# Patient Record
Sex: Female | Born: 1995 | Race: White | Hispanic: No | Marital: Single | Smoking: Former smoker
Health system: Southern US, Community
[De-identification: ages and names within clinical notes are randomized; demographics above are authoritative.]

## PROBLEM LIST (undated history)

## (undated) ENCOUNTER — Inpatient Hospital Stay (HOSPITAL_COMMUNITY): Payer: Self-pay

## (undated) DIAGNOSIS — F419 Anxiety disorder, unspecified: Secondary | ICD-10-CM

## (undated) DIAGNOSIS — Z789 Other specified health status: Secondary | ICD-10-CM

## (undated) DIAGNOSIS — D649 Anemia, unspecified: Secondary | ICD-10-CM

## (undated) HISTORY — PX: NO PAST SURGERIES: SHX2092

## (undated) HISTORY — DX: Anxiety disorder, unspecified: F41.9

---

## 2017-09-13 NOTE — L&D Delivery Note (Signed)
Patient: Hannah SpragueMeilanie Bradford MRN: [id number]  GBS status: negative, IAP given: yes for chorio (amp+gent)  Patient is a 22 y.o. now G1P1 s/p NSVD at 6225w2d, who was admitted for IOL for PROM. SROM 17h 5465m prior to delivery with clear fluid.    Delivery Note At 6:03 AM a viable female was delivered via SVD (Presentation: cephalic; ROA).  APGAR: 4, 6, 9; weight: pending (appears AGA).   Placenta status: intact, 3-vessel cord.  Cord:  with the following complications: none.  Head delivered ROA. No nuchal cord present. Shoulder and body delivered in usual fashion. Infant with spontaneous cry, placed on mother's abdomen, dried and bulb suctioned. Cord clamped x 2 after 1-minute delay, and cut by family member. After initial cry, infant slow to transition and transferred to warmer for resuscitation. NICU team called.  Cord blood drawn. Placenta delivered spontaneously with gentle cord traction. Fundus firm with massage and Pitocin. Perineum inspected and found to have no lacerations, with good hemostasis noted.  Anesthesia: Epidural   Episiotomy: None Lacerations: None Suture Repair: None Est. Blood Loss (mL): 100  Mom to postpartum.  Baby to Couplet care / Skin to Skin.  Gwenevere AbbotNimeka Krishna Dancel Ob Fellow 09/07/2018, 6:27 AM

## 2018-01-25 ENCOUNTER — Inpatient Hospital Stay (HOSPITAL_COMMUNITY): Payer: Medicaid Other

## 2018-01-25 ENCOUNTER — Inpatient Hospital Stay (HOSPITAL_COMMUNITY)
Admission: AD | Admit: 2018-01-25 | Discharge: 2018-01-25 | Disposition: A | Discharge status: Home / Self Care | Payer: Medicaid Other | Source: Ambulatory Visit | Attending: Obstetrics and Gynecology | Admitting: Obstetrics and Gynecology

## 2018-01-25 ENCOUNTER — Encounter (HOSPITAL_COMMUNITY): Payer: Self-pay

## 2018-01-25 ENCOUNTER — Other Ambulatory Visit: Payer: Self-pay

## 2018-01-25 DIAGNOSIS — M545 Low back pain: Secondary | ICD-10-CM | POA: Diagnosis not present

## 2018-01-25 DIAGNOSIS — Z3A01 Less than 8 weeks gestation of pregnancy: Secondary | ICD-10-CM | POA: Diagnosis not present

## 2018-01-25 DIAGNOSIS — Z3491 Encounter for supervision of normal pregnancy, unspecified, first trimester: Secondary | ICD-10-CM

## 2018-01-25 DIAGNOSIS — R109 Unspecified abdominal pain: Secondary | ICD-10-CM | POA: Diagnosis not present

## 2018-01-25 DIAGNOSIS — O10911 Unspecified pre-existing hypertension complicating pregnancy, first trimester: Secondary | ICD-10-CM

## 2018-01-25 DIAGNOSIS — O26891 Other specified pregnancy related conditions, first trimester: Secondary | ICD-10-CM | POA: Diagnosis not present

## 2018-01-25 DIAGNOSIS — O10011 Pre-existing essential hypertension complicating pregnancy, first trimester: Secondary | ICD-10-CM | POA: Insufficient documentation

## 2018-01-25 LAB — URINALYSIS, ROUTINE W REFLEX MICROSCOPIC
BILIRUBIN URINE: NEGATIVE
Glucose, UA: NEGATIVE mg/dL
Hgb urine dipstick: NEGATIVE
KETONES UR: NEGATIVE mg/dL
Leukocytes, UA: NEGATIVE
NITRITE: NEGATIVE
PROTEIN: NEGATIVE mg/dL
SPECIFIC GRAVITY, URINE: 1.016 (ref 1.005–1.030)
pH: 6 (ref 5.0–8.0)

## 2018-01-25 LAB — CBC
HCT: 38.6 % (ref 36.0–46.0)
Hemoglobin: 12.7 g/dL (ref 12.0–15.0)
MCH: 27 pg (ref 26.0–34.0)
MCHC: 32.9 g/dL (ref 30.0–36.0)
MCV: 82.1 fL (ref 78.0–100.0)
Platelets: 279 10*3/uL (ref 150–400)
RBC: 4.7 MIL/uL (ref 3.87–5.11)
RDW: 13.5 % (ref 11.5–15.5)
WBC: 10.7 10*3/uL — ABNORMAL HIGH (ref 4.0–10.5)

## 2018-01-25 LAB — HCG, QUANTITATIVE, PREGNANCY: hCG, Beta Chain, Quant, S: 70649 m[IU]/mL — ABNORMAL HIGH (ref <5)

## 2018-01-25 LAB — WET PREP, GENITAL
Clue Cells Wet Prep HPF POC: NONE SEEN
Trich, Wet Prep: NONE SEEN
Yeast Wet Prep HPF POC: NONE SEEN

## 2018-01-25 LAB — POCT PREGNANCY, URINE: PREG TEST UR: POSITIVE — AB

## 2018-01-25 NOTE — MAU Note (Signed)
Pt states she had a positive pregnancy last Tuesday. Pt states she has had severe back and abdominal pain for the past few days that has worsened today.   Denies vaginal bleeding

## 2018-01-25 NOTE — MAU Provider Note (Signed)
Chief Complaint: Back Pain and Abdominal Pain   First Provider Initiated Contact with Patient 01/25/18 1953     SUBJECTIVE HPI: Hannah Bradford is a 22 y.o. G1P0 at [redacted]w[redacted]d by LMP who presents to maternity admissions reporting abdominal pain and back pain. She reports abdominal pain that has been occurring since "finding out she was pregnant" during the month of April. She describes intermittent lower abdominal cramping, rates pain 3/10- has not taken any medication for pain. Associated symptoms with abdominal pain is back pain, she reports constant back pain since before she got pregnant. She describes the pain as constant aching in lower back, rates pain 7/10- has not taken any medication for pain. She denies vaginal bleeding, vaginal discharge, vaginal itching/burning, urinary symptoms, h/a, dizziness, n/v, or fever/chills.  She reports recent IC this morning.   History reviewed. No pertinent past medical history. History reviewed. No pertinent surgical history. Social History   Socioeconomic History  . Marital status: Single    Spouse name: Not on file  . Number of children: Not on file  . Years of education: Not on file  . Highest education level: Not on file  Occupational History  . Not on file  Social Needs  . Financial resource strain: Not on file  . Food insecurity:    Worry: Not on file    Inability: Not on file  . Transportation needs:    Medical: Not on file    Non-medical: Not on file  Tobacco Use  . Smoking status: Never Smoker  . Smokeless tobacco: Never Used  Substance and Sexual Activity  . Alcohol use: Never    Frequency: Never  . Drug use: Not Currently    Types: Marijuana    Comment: quit when pt found out she was pregnant  . Sexual activity: Yes    Birth control/protection: None  Lifestyle  . Physical activity:    Days per week: Not on file    Minutes per session: Not on file  . Stress: Not on file  Relationships  . Social connections:    Talks on  phone: Not on file    Gets together: Not on file    Attends religious service: Not on file    Active member of club or organization: Not on file    Attends meetings of clubs or organizations: Not on file    Relationship status: Not on file  . Intimate partner violence:    Fear of current or ex partner: Not on file    Emotionally abused: Not on file    Physically abused: Not on file    Forced sexual activity: Not on file  Other Topics Concern  . Not on file  Social History Narrative  . Not on file   No current facility-administered medications on file prior to encounter.    No current outpatient medications on file prior to encounter.   Not on File  ROS:  Review of Systems  Constitutional: Negative.   Respiratory: Negative.   Cardiovascular: Negative.   Gastrointestinal: Positive for abdominal pain. Negative for constipation, diarrhea, nausea and vomiting.  Genitourinary: Negative.   Musculoskeletal: Positive for back pain.   I have reviewed patient's Past Medical Hx, Surgical Hx, Family Hx, Social Hx, medications and allergies.   Physical Exam   Patient Vitals for the past 24 hrs:  BP Temp Temp src Pulse Resp SpO2 Weight  01/25/18 2100 134/62 - - 69 - - -  01/25/18 1942 (!) 146/75 98.7 F (37.1 C)  Oral 96 16 100 % -  01/25/18 1931 - - - - - - 134 lb 12 oz (61.1 kg)   Constitutional: Well-developed, well-nourished female in no acute distress.  Cardiovascular: normal rate Respiratory: normal effort GI: Abd soft, non-tender. Pos BS x 4 MS: Extremities nontender, no edema, normal ROM Neurologic: Alert and oriented x 4.  GU: Neg CVAT.  PELVIC EXAM: blind swabs obtained  Bimanual exam: Cervix 0/long/high, firm, anterior, neg CMT, uterus nontender, nonenlarged, adnexa without tenderness, enlargement, or mass  LAB RESULTS Results for orders placed or performed during the hospital encounter of 01/25/18 (from the past 24 hour(s))  Urinalysis, Routine w reflex microscopic      Status: None   Collection Time: 01/25/18  7:30 PM  Result Value Ref Range   Color, Urine YELLOW YELLOW   APPearance CLEAR CLEAR   Specific Gravity, Urine 1.016 1.005 - 1.030   pH 6.0 5.0 - 8.0   Glucose, UA NEGATIVE NEGATIVE mg/dL   Hgb urine dipstick NEGATIVE NEGATIVE   Bilirubin Urine NEGATIVE NEGATIVE   Ketones, ur NEGATIVE NEGATIVE mg/dL   Protein, ur NEGATIVE NEGATIVE mg/dL   Nitrite NEGATIVE NEGATIVE   Leukocytes, UA NEGATIVE NEGATIVE  Pregnancy, urine POC     Status: Abnormal   Collection Time: 01/25/18  7:41 PM  Result Value Ref Range   Preg Test, Ur POSITIVE (A) NEGATIVE  Wet prep, genital     Status: Abnormal   Collection Time: 01/25/18  7:58 PM  Result Value Ref Range   Yeast Wet Prep HPF POC NONE SEEN NONE SEEN   Trich, Wet Prep NONE SEEN NONE SEEN   Clue Cells Wet Prep HPF POC NONE SEEN NONE SEEN   WBC, Wet Prep HPF POC MODERATE (A) NONE SEEN   Sperm MANY BACTERIA SEEN   CBC     Status: Abnormal   Collection Time: 01/25/18  8:08 PM  Result Value Ref Range   WBC 10.7 (H) 4.0 - 10.5 K/uL   RBC 4.70 3.87 - 5.11 MIL/uL   Hemoglobin 12.7 12.0 - 15.0 g/dL   HCT 96.0 45.4 - 09.8 %   MCV 82.1 78.0 - 100.0 fL   MCH 27.0 26.0 - 34.0 pg   MCHC 32.9 30.0 - 36.0 g/dL   RDW 11.9 14.7 - 82.9 %   Platelets 279 150 - 400 K/uL  ABO/Rh     Status: None (Preliminary result)   Collection Time: 01/25/18  8:08 PM  Result Value Ref Range   ABO/RH(D)      O POS Performed at [facility], [address], [place], [place] [address]     IMAGING US Ob Less Than 14 Weeks With Ob Transvaginal  Result Date: 01/25/2018 CLINICAL DATA:  Severe back and abdominal pain, no vaginal bleeding EXAM: OBSTETRIC <14 WK [place] AND TRANSVAGINAL OB US TECHNIQUE: Both transabdominal and transvaginal ultrasound examinations were performed for complete evaluation of the gestation as well as the maternal uterus, adnexal regions, and pelvic cul-de-sac. Transvaginal technique was performed to assess  early pregnancy. COMPARISON:  None. FINDINGS: Intrauterine gestational sac: Single intrauterine pregnancy Yolk sac:  Visible Embryo:  Visible Cardiac Activity: Visible Heart Rate: 120 bpm CRL: 7.1 mm   6 w   4 d                  [place] EDC: 09/16/2018 Subchorionic hemorrhage:  None visualized. Maternal uterus/adnexae: Ovaries are within normal limits. Left ovary measures 1.3 x 2.4 x 1.8 cm. The right ovary measures  3.1 x 1.8 x 3.2 cm. No significant free fluid. IMPRESSION: Single viable intrauterine pregnancy as above. There are no specific abnormalities seen Electronically Signed   By: Jasmine Pang M.D.   On: 01/25/2018 21:01    MAU Management/MDM: Orders Placed This Encounter  Procedures  . Wet prep, genital  . US OB LESS THAN 14 WEEKS WITH OB TRANSVAGINAL  . Urinalysis, Routine w reflex microscopic  . CBC  . hCG, quantitative, pregnancy  . Pregnancy, urine POC  . ABO/Rh   Wet prep- WNL  UA-negative  CBC-WNL  ABO/Rh- O Pos  GC/C - pending  Discussed results of [place] with patient and reasons to return to MAU   Pt discharged. Pt stable at the time of discharge.   ASSESSMENT 1. Normal IUP (intrauterine pregnancy) on prenatal ultrasound, first trimester   2. Abdominal pain during pregnancy in first trimester   3. Maternal chronic hypertension in first trimester     PLAN Discharge home Make initial prenatal appointment  Return to MAU as needed for increased abdominal pain and/or vaginal bleeding like a period  Continue prenatal vitamins    Allergies as of 01/25/2018   Not on File     Medication List    You have not been prescribed any medications.      Steward Drone  Certified Nurse-Midwife 01/25/2018  9:13 PM

## 2018-01-26 LAB — GC/CHLAMYDIA PROBE AMP (~~LOC~~) NOT AT ARMC
Chlamydia: NEGATIVE
Neisseria Gonorrhea: NEGATIVE

## 2018-01-26 LAB — ABO/RH: ABO/RH(D): O POS

## 2018-03-03 ENCOUNTER — Ambulatory Visit (INDEPENDENT_AMBULATORY_CARE_PROVIDER_SITE_OTHER): Payer: Medicaid Other | Admitting: Nurse Practitioner

## 2018-03-03 ENCOUNTER — Other Ambulatory Visit (HOSPITAL_COMMUNITY)
Admission: RE | Admit: 2018-03-03 | Discharge: 2018-03-03 | Disposition: A | Discharge status: Home / Self Care | Payer: Medicaid Other | Source: Ambulatory Visit | Attending: Nurse Practitioner | Admitting: Nurse Practitioner

## 2018-03-03 ENCOUNTER — Encounter: Payer: Self-pay | Admitting: Nurse Practitioner

## 2018-03-03 VITALS — BP 112/77 | HR 87 | Ht 63.0 in | Wt 128.0 lb

## 2018-03-03 DIAGNOSIS — Z3400 Encounter for supervision of normal first pregnancy, unspecified trimester: Secondary | ICD-10-CM | POA: Diagnosis not present

## 2018-03-03 DIAGNOSIS — O219 Vomiting of pregnancy, unspecified: Secondary | ICD-10-CM | POA: Insufficient documentation

## 2018-03-03 DIAGNOSIS — Z3A00 Weeks of gestation of pregnancy not specified: Secondary | ICD-10-CM | POA: Diagnosis not present

## 2018-03-03 DIAGNOSIS — Z87891 Personal history of nicotine dependence: Secondary | ICD-10-CM | POA: Insufficient documentation

## 2018-03-03 DIAGNOSIS — Z3401 Encounter for supervision of normal first pregnancy, first trimester: Secondary | ICD-10-CM

## 2018-03-03 MED ORDER — DOXYLAMINE-PYRIDOXINE ER 20-20 MG PO TBCR: 1.0000 | EXTENDED_RELEASE_TABLET | Freq: Two times a day (BID) | ORAL | 4 refills | Status: DC

## 2018-03-03 NOTE — Progress Notes (Signed)
Patient is in the office for initial ob visit, fob is involved and living together, no complaints.

## 2018-03-03 NOTE — Patient Instructions (Addendum)
Call 1-800-QUIT-Now for smoking cessation   Morning Sickness Morning sickness is when you feel sick to your stomach (nauseous) during pregnancy. You may feel sick to your stomach and throw up (vomit). You may feel sick in the morning, but you can feel this way any time of day. Some women feel very sick to their stomach and cannot stop throwing up (hyperemesis gravidarum). Follow these instructions at home:  Only take medicines as told by your doctor.  Take multivitamins as told by your doctor. Taking multivitamins before getting pregnant can stop or lessen the harshness of morning sickness.  Eat dry toast or unsalted crackers before getting out of bed.  Eat 5 to 6 small meals a day.  Eat dry and bland foods like rice and baked potatoes.  Do not drink liquids with meals. Drink between meals.  Do not eat greasy, fatty, or spicy foods.  Have someone cook for you if the smell of food causes you to feel sick or throw up.  If you feel sick to your stomach after taking prenatal vitamins, take them at night or with a snack.  Eat protein when you need a snack (nuts, yogurt, cheese).  Eat unsweetened gelatins for dessert.  Wear a bracelet used for sea sickness (acupressure wristband).  Go to a doctor that puts thin needles into certain body points (acupuncture) to improve how you feel.  Do not smoke.  Use a humidifier to keep the air in your house free of odors.  Get lots of fresh air. Contact a doctor if:  You need medicine to feel better.  You feel dizzy or lightheaded.  You are losing weight. Get help right away if:  You feel very sick to your stomach and cannot stop throwing up.  You pass out (faint). This information is not intended to replace advice given to you by your health care provider. Make sure you discuss any questions you have with your health care provider. Document Released: 10/07/2004 Document Revised: 02/05/2016 Document Reviewed: 02/14/2013 Elsevier  Interactive Patient Education  2017 [employer].  First Trimester of Pregnancy The first trimester of pregnancy is from week 1 until the end of week 13 (months 1 through 3). During this time, your baby will begin to develop inside you. At 6-8 weeks, the eyes and face are formed, and the heartbeat can be seen on ultrasound. At the end of 12 weeks, all the baby's organs are formed. Prenatal care is all the medical care you receive before the birth of your baby. Make sure you get good prenatal care and follow all of your doctor's instructions. Follow these instructions at home: Medicines  Take over-the-counter and prescription medicines only as told by your doctor. Some medicines are safe and some medicines are not safe during pregnancy.  Take a prenatal vitamin that contains at least 600 micrograms (mcg) of folic acid.  If you have trouble pooping (constipation), take medicine that will make your stool soft (stool softener) if your doctor approves. Eating and drinking  Eat regular, healthy meals.  Your doctor will tell you the amount of weight gain that is right for you.  Avoid raw meat and uncooked cheese.  If you feel sick to your stomach (nauseous) or throw up (vomit): ? Eat 4 or 5 small meals a day instead of 3 large meals. ? Try eating a few soda crackers. ? Drink liquids between meals instead of during meals.  To prevent constipation: ? Eat foods that are high in fiber, like fresh  fruits and vegetables, whole grains, and beans. ? Drink enough fluids to keep your pee (urine) clear or pale yellow. Activity  Exercise only as told by your doctor. Stop exercising if you have cramps or pain in your lower belly (abdomen) or low back.  Do not exercise if it is too hot, too humid, or if you are in a place of great height (high altitude).  Try to avoid standing for long periods of time. Move your legs often if you must stand in one place for a long time.  Avoid heavy lifting.  Wear  low-heeled shoes. Sit and stand up straight.  You can have sex unless your doctor tells you not to. Relieving pain and discomfort  Wear a good support bra if your breasts are sore.  Take warm water baths (sitz baths) to soothe pain or discomfort caused by hemorrhoids. Use hemorrhoid cream if your doctor says it is okay.  Rest with your legs raised if you have leg cramps or low back pain.  If you have puffy, bulging veins (varicose veins) in your legs: ? Wear support hose or compression stockings as told by your doctor. ? Raise (elevate) your feet for 15 minutes, 3-4 times a day. ? Limit salt in your food. Prenatal care  Schedule your prenatal visits by the twelfth week of pregnancy.  Write down your questions. Take them to your prenatal visits.  Keep all your prenatal visits as told by your doctor. This is important. Safety  Wear your seat belt at all times when driving.  Make a list of emergency phone numbers. The list should include numbers for family, friends, the hospital, and police and fire departments. General instructions  Ask your doctor for a referral to a local prenatal class. Begin classes no later than at the start of month 6 of your pregnancy.  Ask for help if you need counseling or if you need help with nutrition. Your doctor can give you advice or tell you where to go for help.  Do not use hot tubs, steam rooms, or saunas.  Do not douche or use tampons or scented sanitary pads.  Do not cross your legs for long periods of time.  Avoid all herbs and alcohol. Avoid drugs that are not approved by your doctor.  Do not use any tobacco products, including cigarettes, chewing tobacco, and electronic cigarettes. If you need help quitting, ask your doctor. You may get counseling or other support to help you quit.  Avoid cat litter boxes and soil used by cats. These carry germs that can cause birth defects in the baby and can cause a loss of your baby (miscarriage) or  stillbirth.  Visit your dentist. At home, brush your teeth with a soft toothbrush. Be gentle when you floss. Contact a doctor if:  You are dizzy.  You have mild cramps or pressure in your lower belly.  You have a nagging pain in your belly area.  You continue to feel sick to your stomach, you throw up, or you have watery poop (diarrhea).  You have a bad smelling fluid coming from your vagina.  You have pain when you pee (urinate).  You have increased puffiness (swelling) in your face, hands, legs, or ankles. Get help right away if:  You have a fever.  You are leaking fluid from your vagina.  You have spotting or bleeding from your vagina.  You have very bad belly cramping or pain.  You gain or lose weight rapidly.  You  throw up blood. It may look like coffee grounds.  You are around people who have [place] measles, fifth disease, or chickenpox.  You have a very bad headache.  You have shortness of breath.  You have any kind of trauma, such as from a fall or a car accident. Summary  The first trimester of pregnancy is from week 1 until the end of week 13 (months 1 through 3).  To take care of yourself and your unborn baby, you will need to eat healthy meals, take medicines only if your doctor tells you to do so, and do activities that are safe for you and your baby.  Keep all follow-up visits as told by your doctor. This is important as your doctor will have to ensure that your baby is healthy and growing well. This information is not intended to replace advice given to you by your health care provider. Make sure you discuss any questions you have with your health care provider. Document Released: 02/16/2008 Document Revised: 09/07/2016 Document Reviewed: 09/07/2016 Elsevier Interactive Patient Education  2017 [employer].

## 2018-03-03 NOTE — Progress Notes (Signed)
Subjective:   Hannah Bradford is a 22 y.o. G1P0 at 4768w3d by LMP and confirmed by early ultrasound being seen today for her first obstetrical visit.  Her obstetrical history is significant for nausea and vomiting in early pregnancy and she recently quit smoking marijuana and cigarettes.. Patient does intend to breast feed. Pregnancy history fully reviewed.  Patient reports periodic left side pain that stabs and then quickly resolves.  HISTORY: OB History  Gravida Para Term Preterm AB Living  1 0 0 0 0 0  SAB TAB Ectopic Multiple Live Births  0 0 0 0 0    # Outcome Date GA Lbr Len/2nd Weight Sex Delivery Anes PTL Lv  1 Current            History reviewed. No pertinent past medical history. History reviewed. No pertinent surgical history. Family History  Problem Relation Age of Onset  . Hypertension Maternal Grandmother   . Hypertension Paternal Grandmother   . Diabetes Paternal Grandmother    Social History   Tobacco Use  . Smoking status: Former Smoker    Types: Cigarettes    Last attempt to quit: 2019    Years since quitting: 0.4  . Smokeless tobacco: Never Used  Substance Use Topics  . Alcohol use: Never    Frequency: Never  . Drug use: Not Currently    Types: Marijuana    Comment: quit when pt found out she was pregnant   No Known Allergies Current Outpatient Medications on File Prior to Visit  Medication Sig Dispense Refill  . Prenatal Vit-Fe Phos-FA-Omega (VITAFOL GUMMIES) 3.33-0.333-34.8 MG CHEW Chew 3 each by mouth daily.     No current facility-administered medications on file prior to visit.      Exam   Vitals:   03/03/18 1119 03/03/18 1120  BP: 112/77   Pulse: 87   Weight: 128 lb (58.1 kg)   Height:  5\' 3"  (1.6 m)   Fetal Heart Rate (bpm): 154  Uterus:  Fundal Height: 11 cm  Size equal to dates  Pelvic Exam: Perineum: no hemorrhoids, normal perineum   Vulva: normal external genitalia, no lesions   Vagina:  normal mucosa, normal discharge     Cervix: no lesions and normal, pap smear done.    Adnexa: normal adnexa and no mass, fullness, tenderness   Bony Pelvis: average  System: General: well-developed, well-nourished female in no acute distress   Breast:  normal appearance, no masses or tenderness   Skin: normal coloration and turgor, no rashes   Neurologic: oriented, normal, negative, normal mood   Extremities: normal strength, tone, and muscle mass, ROM of all joints is normal   HEENT PERRLA, extraocular movement intact and sclera clear, anicteric   Mouth/Teeth mucous membranes moist and dental hygiene good   Neck supple and no masses   Cardiovascular: regular rate and rhythm   Respiratory:  no respiratory distress, normal breath sounds   Abdomen: soft, non-tender; no masses,  no organomegaly, no pain with palpation in left side     Assessment:   Pregnancy: G1P0 Patient Active Problem List   Diagnosis Date Noted  . Supervision of normal first pregnancy, antepartum 03/03/2018  . Former smoker 03/03/2018  . Nausea and vomiting during pregnancy prior to [redacted] weeks gestation 03/03/2018     Plan:  1. Supervision of normal first pregnancy, antepartum Expect weight gain of 25-35 pounds  - Cytology - PAP - Obstetric Panel, Including HIV - Hemoglobinopathy evaluation - Culture, OB Urine -  Cystic Fibrosis Mutation 97 - Genetic Screening - Enroll Patient in Babyscripts - [place] MFM OB COMP + 14 WK; Future  2. Nausea and vomiting during pregnancy prior to [redacted] weeks gestation Samples of Bonjesta given and Bonjesta prescribed.  Initial labs drawn. Continue prenatal vitamins. Genetic Screening discussed, NIPS: ordered. Ultrasound discussed; fetal anatomic survey: ordered. Problem list reviewed and updated. The nature of  - [facility] with multiple MDs and other Advanced Practice Providers was explained to patient; also emphasized that residents, students are part of our team. Routine  obstetric precautions reviewed. Return in about 1 month (around 03/31/2018).  Return sooner if having problems with vomiting or other medical concerns.  Total face-to-face time with patient: 40 minutes.  Over 50% of encounter was spent on counseling and coordination of care.     Nolene Bernheim, FNP Family Nurse Practitioner, [facility] for [employer], [facility] 03/03/2018 12:25 PM

## 2018-03-05 LAB — URINE CULTURE, OB REFLEX

## 2018-03-05 LAB — CULTURE, OB URINE

## 2018-03-06 LAB — CYTOLOGY - PAP
Chlamydia: NEGATIVE
Diagnosis: NEGATIVE
NEISSERIA GONORRHEA: NEGATIVE

## 2018-03-09 ENCOUNTER — Encounter: Payer: Self-pay | Admitting: *Deleted

## 2018-03-13 LAB — OBSTETRIC PANEL, INCLUDING HIV
ANTIBODY SCREEN: NEGATIVE
BASOS ABS: 0 10*3/uL (ref 0.0–0.2)
BASOS: 0 %
EOS (ABSOLUTE): 0.1 10*3/uL (ref 0.0–0.4)
EOS: 1 %
HEMATOCRIT: 40.2 % (ref 34.0–46.6)
HIV Screen 4th Generation wRfx: NONREACTIVE
Hemoglobin: 12.6 g/dL (ref 11.1–15.9)
Hepatitis B Surface Ag: NEGATIVE
Immature Grans (Abs): 0 10*3/uL (ref 0.0–0.1)
Immature Granulocytes: 0 %
LYMPHS ABS: 1.6 10*3/uL (ref 0.7–3.1)
Lymphs: 16 %
MCH: 26 pg — AB (ref 26.6–33.0)
MCHC: 31.3 g/dL — ABNORMAL LOW (ref 31.5–35.7)
MCV: 83 fL (ref 79–97)
MONOCYTES: 6 %
Monocytes Absolute: 0.6 10*3/uL (ref 0.1–0.9)
NEUTROS ABS: 7.8 10*3/uL — AB (ref 1.4–7.0)
Neutrophils: 77 %
PLATELETS: 307 10*3/uL (ref 150–450)
RBC: 4.84 x10E6/uL (ref 3.77–5.28)
RDW: 13.6 % (ref 12.3–15.4)
RH TYPE: POSITIVE
RPR Ser Ql: NONREACTIVE
RUBELLA: 3.27 {index} (ref >0.99)
WBC: 10.1 10*3/uL (ref 3.4–10.8)

## 2018-03-13 LAB — CYSTIC FIBROSIS MUTATION 97: GENE DIS ANAL CARRIER INTERP BLD/T-IMP: NOT DETECTED

## 2018-03-13 LAB — HEMOGLOBINOPATHY EVALUATION
HEMOGLOBIN A2 QUANTITATION: 2.5 % (ref 1.8–3.2)
HGB A: 97.5 % (ref 96.4–98.8)
HGB C: 0 %
HGB S: 0 %
HGB VARIANT: 0 %
Hemoglobin F Quantitation: 0 % (ref 0.0–2.0)

## 2018-04-03 ENCOUNTER — Encounter: Payer: Medicaid Other | Admitting: Advanced Practice Midwife

## 2018-04-19 ENCOUNTER — Ambulatory Visit (INDEPENDENT_AMBULATORY_CARE_PROVIDER_SITE_OTHER): Payer: Medicaid Other | Admitting: Obstetrics and Gynecology

## 2018-04-19 ENCOUNTER — Encounter: Payer: Self-pay | Admitting: Obstetrics and Gynecology

## 2018-04-19 VITALS — BP 121/78 | HR 87 | Wt 135.0 lb

## 2018-04-19 DIAGNOSIS — Z3400 Encounter for supervision of normal first pregnancy, unspecified trimester: Secondary | ICD-10-CM | POA: Diagnosis not present

## 2018-04-19 NOTE — Progress Notes (Signed)
   PRENATAL VISIT NOTE  Subjective:  Hannah Bradford is a 22 y.o. G1P0 at 1773w1d being seen today for ongoing prenatal care.  She is currently monitored for the following issues for this low-risk pregnancy and has Supervision of normal first pregnancy, antepartum; Former smoker; and Nausea and vomiting during pregnancy prior to [redacted] weeks gestation on their problem list.  Patient reports no complaints.  Contractions: Not present. Vag. Bleeding: None.  Movement: Present. Denies leaking of fluid.   The following portions of the patient's history were reviewed and updated as appropriate: allergies, current medications, past family history, past medical history, past social history, past surgical history and problem list. Problem list updated.  Objective:   Vitals:   04/19/18 1422  BP: 121/78  Pulse: 87  Weight: 135 lb (61.2 kg)    Fetal Status: Fetal Heart Rate (bpm): 142   Movement: Present     General:  Alert, oriented and cooperative. Patient is in no acute distress.  Skin: Skin is warm and dry. No rash noted.   Cardiovascular: Normal heart rate noted  Respiratory: Normal respiratory effort, no problems with respiration noted  Abdomen: Soft, gravid, appropriate for gestational age.  Pain/Pressure: Absent     Pelvic: Cervical exam deferred        Extremities: Normal range of motion.  Edema: None  Mental Status: Normal mood and affect. Normal behavior. Normal judgment and thought content.   Assessment and Plan:  Pregnancy: G1P0 at 2273w1d  1. Supervision of normal first pregnancy, antepartum Patient is doing well without complaints AFP today Anatomy ultrasound 8/28 - AFP, Serum, Open Spina Bifida  Preterm labor symptoms and general obstetric precautions including but not limited to vaginal bleeding, contractions, leaking of fluid and fetal movement were reviewed in detail with the patient. Please refer to After Visit Summary for other counseling recommendations.  Return in about 1  month (around 05/17/2018) for ROB.  Future Appointments  Date Time Provider Department Center  04/19/2018  2:30 PM Henessy Rohrer, Gigi GinPeggy, MD [facility] None  05/10/2018 10:15 AM [facility] US 4 [facility]    Catalina AntiguaPeggy Ruweyda Macknight, MD

## 2018-04-19 NOTE — Progress Notes (Signed)
Pt is G1P0 37109w1d here for ROB. No complaints

## 2018-04-21 LAB — AFP, SERUM, OPEN SPINA BIFIDA
AFP MOM: 1.84
AFP Value: 84.4 ng/mL
Gest. Age on Collection Date: 18.1 weeks
Maternal Age At EDD: 21.8 yr
OSBR RISK 1 IN: 1162
Test Results:: NEGATIVE
WEIGHT: 135 [lb_av]

## 2018-04-26 ENCOUNTER — Ambulatory Visit (HOSPITAL_COMMUNITY)
Admission: RE | Admit: 2018-04-26 | Discharge: 2018-04-26 | Disposition: A | Discharge status: Home / Self Care | Payer: Medicaid Other | Source: Ambulatory Visit | Attending: Nurse Practitioner | Admitting: Nurse Practitioner

## 2018-04-26 DIAGNOSIS — Z3482 Encounter for supervision of other normal pregnancy, second trimester: Secondary | ICD-10-CM | POA: Diagnosis not present

## 2018-04-26 DIAGNOSIS — Z3400 Encounter for supervision of normal first pregnancy, unspecified trimester: Secondary | ICD-10-CM

## 2018-04-26 DIAGNOSIS — Z3A19 19 weeks gestation of pregnancy: Secondary | ICD-10-CM

## 2018-04-26 DIAGNOSIS — Z363 Encounter for antenatal screening for malformations: Secondary | ICD-10-CM | POA: Diagnosis not present

## 2018-05-10 ENCOUNTER — Ambulatory Visit (HOSPITAL_COMMUNITY): Payer: Medicaid Other

## 2018-05-18 ENCOUNTER — Ambulatory Visit (INDEPENDENT_AMBULATORY_CARE_PROVIDER_SITE_OTHER): Payer: Medicaid Other | Admitting: Obstetrics and Gynecology

## 2018-05-18 ENCOUNTER — Encounter: Payer: Self-pay | Admitting: Obstetrics and Gynecology

## 2018-05-18 VITALS — BP 116/79 | HR 74 | Wt 137.0 lb

## 2018-05-18 DIAGNOSIS — Z3400 Encounter for supervision of normal first pregnancy, unspecified trimester: Secondary | ICD-10-CM

## 2018-05-18 MED ORDER — NYSTATIN 100000 UNIT/GM EX OINT: 1.0000 "application " | TOPICAL_OINTMENT | Freq: Two times a day (BID) | CUTANEOUS | 1 refills | Status: DC

## 2018-05-18 MED ORDER — VITAFOL GUMMIES 3.33-0.333-34.8 MG PO CHEW: 2.0000 | CHEWABLE_TABLET | Freq: Every day | ORAL | 6 refills | Status: AC

## 2018-05-18 NOTE — Progress Notes (Signed)
ROB w/ complaints of cramping pain worse at night pt notes some nights she cannot sleep well at all.  Does not want medication management for pain.   Recently dealing w/ stuffy nose and nose bleeds advised can purchase humidifier (cool mist)   Pt had anatomy U/S: 04/26/18

## 2018-05-18 NOTE — Progress Notes (Signed)
   PRENATAL VISIT NOTE  Subjective:  Hannah Bradford is a 22 y.o. G1P0 at [redacted]w[redacted]d being seen today for ongoing prenatal care.  She is currently monitored for the following issues for this low-risk pregnancy and has Supervision of normal first pregnancy, antepartum; Former smoker; and Nausea and vomiting during pregnancy prior to [redacted] weeks gestation on their problem list.  Patient reports no complaints.  Contractions: Not present. Vag. Bleeding: None.  Movement: Present. Denies leaking of fluid.   The following portions of the patient's history were reviewed and updated as appropriate: allergies, current medications, past family history, past medical history, past social history, past surgical history and problem list. Problem list updated.  Objective:   Vitals:   05/18/18 1537  BP: 116/79  Pulse: 74  Weight: 137 lb (62.1 kg)    Fetal Status: Fetal Heart Rate (bpm): 138 Fundal Height: 22 cm Movement: Present     General:  Alert, oriented and cooperative. Patient is in no acute distress.  Skin: Skin is warm and dry. No rash noted.   Cardiovascular: Normal heart rate noted  Respiratory: Normal respiratory effort, no problems with respiration noted  Abdomen: Soft, gravid, appropriate for gestational age.  Pain/Pressure: Present     Pelvic: Cervical exam deferred        Extremities: Normal range of motion.  Edema: None  Mental Status: Normal mood and affect. Normal behavior. Normal judgment and thought content.   Assessment and Plan:  Pregnancy: G1P0 at [redacted]w[redacted]d  1. Supervision of normal first pregnancy, antepartum Patient is doing well  Ultrasound report reviewed Reassurance provided regarding occasional cramping pain   Preterm labor symptoms and general obstetric precautions including but not limited to vaginal bleeding, contractions, leaking of fluid and fetal movement were reviewed in detail with the patient. Please refer to After Visit Summary for other counseling recommendations.    Return in about 4 weeks (around 06/15/2018) for ROB, 2 hr glucola next visit.  No future appointments.  Catalina Antigua, MD

## 2018-06-15 ENCOUNTER — Encounter: Payer: Medicaid Other | Admitting: Obstetrics and Gynecology

## 2018-06-15 ENCOUNTER — Other Ambulatory Visit: Payer: Medicaid Other

## 2018-06-22 ENCOUNTER — Encounter (HOSPITAL_COMMUNITY): Payer: Self-pay

## 2018-06-22 ENCOUNTER — Inpatient Hospital Stay (HOSPITAL_COMMUNITY)
Admission: AD | Admit: 2018-06-22 | Discharge: 2018-06-22 | Disposition: A | Discharge status: Home / Self Care | Payer: Medicaid Other | Source: Ambulatory Visit | Attending: Obstetrics and Gynecology | Admitting: Obstetrics and Gynecology

## 2018-06-22 DIAGNOSIS — O99012 Anemia complicating pregnancy, second trimester: Secondary | ICD-10-CM | POA: Diagnosis not present

## 2018-06-22 DIAGNOSIS — O9989 Other specified diseases and conditions complicating pregnancy, childbirth and the puerperium: Secondary | ICD-10-CM

## 2018-06-22 DIAGNOSIS — W57XXXA Bitten or stung by nonvenomous insect and other nonvenomous arthropods, initial encounter: Secondary | ICD-10-CM

## 2018-06-22 DIAGNOSIS — D649 Anemia, unspecified: Secondary | ICD-10-CM | POA: Diagnosis not present

## 2018-06-22 DIAGNOSIS — Z3A27 27 weeks gestation of pregnancy: Secondary | ICD-10-CM | POA: Diagnosis not present

## 2018-06-22 DIAGNOSIS — T148XXA Other injury of unspecified body region, initial encounter: Secondary | ICD-10-CM | POA: Insufficient documentation

## 2018-06-22 DIAGNOSIS — O26892 Other specified pregnancy related conditions, second trimester: Secondary | ICD-10-CM | POA: Diagnosis not present

## 2018-06-22 DIAGNOSIS — Z87891 Personal history of nicotine dependence: Secondary | ICD-10-CM | POA: Diagnosis not present

## 2018-06-22 DIAGNOSIS — R21 Rash and other nonspecific skin eruption: Secondary | ICD-10-CM | POA: Diagnosis present

## 2018-06-22 DIAGNOSIS — L089 Local infection of the skin and subcutaneous tissue, unspecified: Secondary | ICD-10-CM

## 2018-06-22 LAB — CBC WITH DIFFERENTIAL/PLATELET
Basophils Absolute: 0 10*3/uL (ref 0.0–0.1)
Basophils Relative: 0 %
Eosinophils Absolute: 0.2 10*3/uL (ref 0.0–0.5)
Eosinophils Relative: 2 %
HEMATOCRIT: 29.3 % — AB (ref 36.0–46.0)
HEMOGLOBIN: 9.6 g/dL — AB (ref 12.0–15.0)
LYMPHS ABS: 2.4 10*3/uL (ref 0.7–4.0)
Lymphocytes Relative: 23 %
MCH: 27.3 pg (ref 26.0–34.0)
MCHC: 32.8 g/dL (ref 30.0–36.0)
MCV: 83.2 fL (ref 80.0–100.0)
MONOS PCT: 5 %
Monocytes Absolute: 0.5 10*3/uL (ref 0.1–1.0)
NEUTROS PCT: 70 %
Neutro Abs: 7.4 10*3/uL (ref 1.7–7.7)
Platelets: 247 10*3/uL (ref 150–400)
RBC: 3.52 MIL/uL — ABNORMAL LOW (ref 3.87–5.11)
RDW: 13.1 % (ref 11.5–15.5)
WBC: 10.6 10*3/uL — ABNORMAL HIGH (ref 4.0–10.5)
nRBC: 0 % (ref 0.0–0.2)

## 2018-06-22 LAB — URINALYSIS, ROUTINE W REFLEX MICROSCOPIC
Bilirubin Urine: NEGATIVE
GLUCOSE, UA: NEGATIVE mg/dL
HGB URINE DIPSTICK: NEGATIVE
KETONES UR: NEGATIVE mg/dL
NITRITE: NEGATIVE
Protein, ur: NEGATIVE mg/dL
Specific Gravity, Urine: 1.019 (ref 1.005–1.030)
pH: 7 (ref 5.0–8.0)

## 2018-06-22 MED ORDER — HYDROXYZINE HCL 25 MG PO TABS
25.0000 mg | ORAL_TABLET | Freq: Once | ORAL | Status: AC
  Administered 2018-06-22: 25 mg via ORAL
  Filled 2018-06-22: qty 1

## 2018-06-22 MED ORDER — HYDROXYZINE HCL 25 MG PO TABS: 25.0000 mg | ORAL_TABLET | Freq: Four times a day (QID) | ORAL | 2 refills | Status: DC | PRN

## 2018-06-22 MED ORDER — HYDROCORTISONE 1 % EX CREA: 1.0000 "application " | TOPICAL_CREAM | Freq: Two times a day (BID) | CUTANEOUS | 0 refills | Status: DC

## 2018-06-22 MED ORDER — HYDROXYZINE HCL 25 MG PO TABS: 25.0000 mg | ORAL_TABLET | Freq: Three times a day (TID) | ORAL | Status: DC | PRN

## 2018-06-22 NOTE — Discharge Instructions (Signed)
Rash A rash is a change in the color of the skin. A rash can also change the way your skin feels. There are many different conditions and factors that can cause a rash. Follow these instructions at home: Pay attention to any changes in your symptoms. Follow these instructions to help with your condition: Medicine Take or apply over-the-counter and prescription medicines only as told by your health care provider. These may include:  Corticosteroid cream.  Anti-itch lotions.  Oral antihistamines.  Skin Care  Apply cool compresses to the affected areas.  Try taking a bath with: ? Epsom salts. Follow the instructions on the packaging. You can get these at your local pharmacy or grocery store. ? Baking soda. Pour a small amount into the bath as told by your health care provider. ? Colloidal oatmeal. Follow the instructions on the packaging. You can get this at your local pharmacy or grocery store.  Try applying baking soda paste to your skin. Stir water into baking soda until it reaches a paste-like consistency.  Do not scratch or rub your skin.  Avoid covering the rash. Make sure the rash is exposed to air as much as possible. General instructions  Avoid hot showers or baths, which can make itching worse. A cold shower may help.  Avoid scented soaps, detergents, and perfumes. Use gentle soaps, detergents, perfumes, and other cosmetic products.  Avoid any substance that causes your rash. Keep a journal to help track what causes your rash. Write down: ? What you eat. ? What cosmetic products you use. ? What you drink. ? What you wear. This includes jewelry.  Keep all follow-up visits as told by your health care provider. This is important. Contact a health care provider if:  You sweat at night.  You lose weight.  You urinate more than normal.  You feel weak.  You vomit.  Your skin or the whites of your eyes look yellow (jaundice).  Your skin: ? Tingles. ? Is  numb.  Your rash: ? Does not go away after several days. ? Gets worse.  You are: ? Unusually thirsty. ? More tired than normal.  You have: ? New symptoms. ? Pain in your abdomen. ? A fever. ? Diarrhea. Get help right away if:  You develop a rash that covers all or most of your body. The rash may or may not be painful.  You develop blisters that: ? Are on top of the rash. ? Grow larger or grow together. ? Are painful. ? Are inside your nose or mouth.  You develop a rash that: ? Looks like purple pinprick-sized spots all over your body. ? Has a bull's eye or looks like a target. ? Is not related to sun exposure, is red and painful, and causes your skin to peel. This information is not intended to replace advice given to you by your health care provider. Make sure you discuss any questions you have with your health care provider. Document Released: 08/20/2002 Document Revised: 02/03/2016 Document Reviewed: 01/15/2015 Elsevier Interactive Patient Education  2018 [employer]. IT sales professional, Adult An insect bite can make your skin red, itchy, and swollen. An insect bite is different from an insect sting, which happens when an insect injects poison (venom) into the skin. Some insects can spread disease to people through a bite. However, most insect bites do not lead to disease and are not serious. What are the causes? Insects may bite for a variety of reasons, including:  Hunger.  To defend  themselves.  Insects that bite include:  Spiders.  Mosquitoes.  Ticks.  Fleas.  Ants.  Flies.  Bedbugs.  What are the signs or symptoms? Symptoms of this condition include:  Itching or pain in the bite area.  Redness and swelling in the bite area.  An open wound (skin ulcer).  In many cases, symptoms last for 2-4 days. How is this diagnosed? This condition is usually diagnosed based on symptoms and a physical exam. How is this treated? Treatment is usually not  needed. Symptoms often go away on their own. When treatment is recommended, it may involve:  Applying a cream or lotion to the bitten area. This treatment helps with itching.  Taking an antibiotic medicine. This treatment is needed if the bite area gets infected.  Getting a tetanus shot.  Applying ice to the affected area.  Medicines called antihistamines. This treatment is needed if you develop an allergic reaction to the insect bite.  Follow these instructions at home: Bite area care  Do not scratch the bite area.  Keep the bite area clean and dry. Wash it every day with soap and water as told by your health care provider.  Check the bite area every day for signs of infection. Check for: ? More redness, swelling, or pain. ? Fluid or blood. ? Warmth. ? Pus. Managing pain, itching, and swelling   You may apply a baking soda paste, cortisone cream, or calamine lotion to the bite area as told by your health care provider.  If directed, applyice to the bite area. ? Put ice in a plastic bag. ? Place a towel between your skin and the bag. ? Leave the ice on for 20 minutes, 2-3 times per day. Medicines  Apply or take over-the-counter and prescription medicines only as told by your health care provider.  If you were prescribed an antibiotic medicine, use it as told by your health care provider. Do not stop using the antibiotic even if your condition improves. General instructions  Keep all follow-up visits as told by your health care provider. This is important. How is this prevented? To help reduce your risk of insect bites:  When you are outdoors, wear clothing that covers your arms and legs.  Use insect repellent. The best insect repellents contain: ? DEET, picaridin, oil of lemon eucalyptus (OLE), or IR3535. ? Higher amounts of an active ingredient.  If your home windows do not have screens, consider installing them.  Contact a health care provider if:  You have  more redness, swelling, or pain in the bite area.  You have fluid, blood, or pus coming from the bite area.  The bite area feels warm to the touch.  You have a fever. Get help right away if:  You have joint pain.  You have a rash.  You have shortness of breath.  You feel unusually tired or sleepy.  You have neck pain.  You have a headache.  You have unusual weakness.  You have chest pain.  You have nausea, vomiting, or pain in the abdomen. This information is not intended to replace advice given to you by your health care provider. Make sure you discuss any questions you have with your health care provider. Document Released: 10/07/2004 Document Revised: 04/28/2016 Document Reviewed: 03/08/2016 Elsevier Interactive Patient Education  [employer].

## 2018-06-22 NOTE — MAU Note (Signed)
Pt reports a rash or bites that are on her back hand legs face toes. Started 2 days ago. Very itchy hard to sleep and get comfortable.

## 2018-06-22 NOTE — MAU Provider Note (Signed)
Chief Complaint:  Rash   First Provider Initiated Contact with Patient 06/22/18 2148     HPI: Hannah Bradford is a 22 y.o. G1P0 at 56w2dwho presents to maternity admissions reporting rash/spots all over body since 2 days ago.  They itch and are scattered all over.  No known exposure to anything new, except new sheets she did not wash.  Was standing outside a long time during move.  No one else in family has these lesions. . She reports good fetal movement, denies LOF, vaginal bleeding, vaginal itching/burning, urinary symptoms, h/a, dizziness, n/v, diarrhea, constipation or fever/chills.    Rash  This is a new problem. The current episode started in the past 7 days. The problem is unchanged. The affected locations include the chest, back, abdomen, left arm, left upper leg, right arm and right upper leg. The rash is characterized by itchiness and redness. It is unknown if there was an exposure to a precipitant. Pertinent negatives include no anorexia, congestion, cough, diarrhea, facial edema, fatigue, fever, rhinorrhea or shortness of breath. Past treatments include nothing. There is no history of varicella.    RN Note: Pt reports a rash or bites that are on her back hand legs face toes. Started 2 days ago. Very itchy hard to sleep and get comfortable  Past Medical History: History reviewed. No pertinent past medical history.  Past obstetric history: OB History  Gravida Para Term Preterm AB Living  1            SAB TAB Ectopic Multiple Live Births               # Outcome Date GA Lbr Len/2nd Weight Sex Delivery Anes PTL Lv  1 Current             Past Surgical History: No past surgical history on file.  Family History: Family History  Problem Relation Age of Onset  . Hypertension Maternal Grandmother   . Hypertension Paternal Grandmother   . Diabetes Paternal Grandmother     Social History: Social History   Tobacco Use  . Smoking status: Former Smoker    Types: Cigarettes      Last attempt to quit: 2019    Years since quitting: 0.7  . Smokeless tobacco: Never Used  Substance Use Topics  . Alcohol use: Never    Frequency: Never  . Drug use: Not Currently    Types: Marijuana    Comment: quit when pt found out she was pregnant    Allergies: No Known Allergies  Meds:  Medications Prior to Admission  Medication Sig Dispense Refill Last Dose  . Doxylamine-Pyridoxine ER (BONJESTA) 20-20 MG TBCR Take 1 tablet by mouth 2 (two) times daily. Take at bedtime and also in AM if needed (Patient not taking: Reported on 04/19/2018) 60 tablet 4 Not Taking  . nystatin ointment (MYCOSTATIN) Apply 1 application topically 2 (two) times daily. 30 g 1   . Prenatal Vit-Fe Phos-FA-Omega (VITAFOL GUMMIES) 3.33-0.333-34.8 MG CHEW Chew 3 each by mouth daily.   Taking    I have reviewed patient's Past Medical Hx, Surgical Hx, Family Hx, Social Hx, medications and allergies.   ROS:  Review of Systems  Constitutional: Negative for fatigue and fever.  HENT: Negative for congestion and rhinorrhea.   Respiratory: Negative for cough and shortness of breath.   Gastrointestinal: Negative for anorexia and diarrhea.  Skin: Positive for rash.   Other systems negative  Physical Exam   Patient Vitals for the past 24 hrs:  BP Temp Pulse Resp Height Weight  06/22/18 2126 119/65 98.2 F (36.8 C) (!) 102 18 5\' 3"  (1.6 m) 64.4 kg   Constitutional: Well-developed, well-nourished female in no acute distress.  Cardiovascular: normal rate and rhythm Respiratory: normal effort, clear to auscultation bilaterally GI: Abd soft, non-tender, gravid appropriate for gestational age.   No rebound or guarding. MS: Extremities nontender, no edema, normal ROM Neurologic: Alert and oriented x 4.  Skin:  Diffuse papular lesions on various parts of body.  Some opened/unroofed by scratching. Not moist or draining.  Itchy  :              FHT:  Baseline 145 , moderate variability, accelerations  present, no decelerations Contractions: Rare   Labs: No results found for this or any previous visit (from the past 24 hour(s)). O/Positive/-- (06/21 1217)  Imaging:  No results found.  MAU Course/MDM: NST reviewed and is reassuring Vistaril given with some relief of itching Consult Dr Alysia Penna with presentation, exam findings and test results.  CBC done to rule out systemic infection, normal CBC and differential  Assessment: Single intrauterine pregnancy at [redacted]w[redacted]d Diffuse papular lesions, likely insect bites Mild anemia, incidental finding  Plan: Discharge home Rx Vistaril for prn use for itching Rx Cortisone 1% cream for lesions Follow up in Office for prenatal visits and recheck of status Come back if develops fever or other somatic symptoms Varicella IgM/IgG sent to document immunity and verify diagnosis  Pt stable at time of discharge.  Wynelle Bourgeois CNM, MSN Certified Nurse-Midwife 06/22/2018 9:49 PM

## 2018-06-23 DIAGNOSIS — D649 Anemia, unspecified: Secondary | ICD-10-CM | POA: Diagnosis present

## 2018-06-23 DIAGNOSIS — R21 Rash and other nonspecific skin eruption: Secondary | ICD-10-CM | POA: Diagnosis present

## 2018-06-24 LAB — VARICELLA ZOSTER ANTIBODY, IGG: Varicella IgG: <135 index — ABNORMAL LOW (ref >165)

## 2018-06-26 ENCOUNTER — Inpatient Hospital Stay (HOSPITAL_COMMUNITY)
Admission: AD | Admit: 2018-06-26 | Discharge: 2018-06-26 | Disposition: A | Discharge status: Home / Self Care | Payer: Medicaid Other | Source: Ambulatory Visit | Attending: Obstetrics and Gynecology | Admitting: Obstetrics and Gynecology

## 2018-06-26 ENCOUNTER — Encounter (HOSPITAL_COMMUNITY): Payer: Self-pay

## 2018-06-26 DIAGNOSIS — Z3A27 27 weeks gestation of pregnancy: Secondary | ICD-10-CM

## 2018-06-26 DIAGNOSIS — O26892 Other specified pregnancy related conditions, second trimester: Secondary | ICD-10-CM | POA: Insufficient documentation

## 2018-06-26 DIAGNOSIS — O99323 Drug use complicating pregnancy, third trimester: Secondary | ICD-10-CM

## 2018-06-26 DIAGNOSIS — Z87891 Personal history of nicotine dependence: Secondary | ICD-10-CM | POA: Diagnosis not present

## 2018-06-26 DIAGNOSIS — F1910 Other psychoactive substance abuse, uncomplicated: Secondary | ICD-10-CM

## 2018-06-26 DIAGNOSIS — R079 Chest pain, unspecified: Secondary | ICD-10-CM | POA: Insufficient documentation

## 2018-06-26 DIAGNOSIS — Z3689 Encounter for other specified antenatal screening: Secondary | ICD-10-CM

## 2018-06-26 DIAGNOSIS — O99321 Drug use complicating pregnancy, first trimester: Secondary | ICD-10-CM

## 2018-06-26 DIAGNOSIS — R42 Dizziness and giddiness: Secondary | ICD-10-CM | POA: Diagnosis not present

## 2018-06-26 HISTORY — DX: Other specified health status: Z78.9

## 2018-06-26 LAB — CBC
HCT: 27.8 % — ABNORMAL LOW (ref 36.0–46.0)
Hemoglobin: 9.5 g/dL — ABNORMAL LOW (ref 12.0–15.0)
MCH: 27.9 pg (ref 26.0–34.0)
MCHC: 34.2 g/dL (ref 30.0–36.0)
MCV: 81.5 fL (ref 80.0–100.0)
PLATELETS: 210 10*3/uL (ref 150–400)
RBC: 3.41 MIL/uL — AB (ref 3.87–5.11)
RDW: 12.9 % (ref 11.5–15.5)
WBC: 10.5 10*3/uL (ref 4.0–10.5)
nRBC: 0 % (ref 0.0–0.2)

## 2018-06-26 LAB — URINALYSIS, ROUTINE W REFLEX MICROSCOPIC
Bilirubin Urine: NEGATIVE
Glucose, UA: NEGATIVE mg/dL
Hgb urine dipstick: NEGATIVE
KETONES UR: NEGATIVE mg/dL
Nitrite: NEGATIVE
PH: 8 (ref 5.0–8.0)
Protein, ur: NEGATIVE mg/dL
Specific Gravity, Urine: 1.02 (ref 1.005–1.030)

## 2018-06-26 LAB — VARICELLA ZOSTER ANTIBODY, IGM

## 2018-06-26 LAB — COMPREHENSIVE METABOLIC PANEL
ALBUMIN: 3 g/dL — AB (ref 3.5–5.0)
ALT: 11 U/L (ref 0–44)
AST: 20 U/L (ref 15–41)
Alkaline Phosphatase: 79 U/L (ref 38–126)
Anion gap: 9 (ref 5–15)
BUN: 10 mg/dL (ref 6–20)
CHLORIDE: 104 mmol/L (ref 98–111)
CO2: 22 mmol/L (ref 22–32)
CREATININE: 0.48 mg/dL (ref 0.44–1.00)
Calcium: 8.5 mg/dL — ABNORMAL LOW (ref 8.9–10.3)
GFR calc Af Amer: >60 mL/min (ref >60)
Glucose, Bld: 103 mg/dL — ABNORMAL HIGH (ref 70–99)
POTASSIUM: 3.8 mmol/L (ref 3.5–5.1)
SODIUM: 135 mmol/L (ref 135–145)
Total Bilirubin: 0.6 mg/dL (ref 0.3–1.2)
Total Protein: 6.2 g/dL — ABNORMAL LOW (ref 6.5–8.1)

## 2018-06-26 LAB — RAPID URINE DRUG SCREEN, HOSP PERFORMED
AMPHETAMINES: NOT DETECTED
Barbiturates: NOT DETECTED
Benzodiazepines: NOT DETECTED
Cocaine: NOT DETECTED
Opiates: NOT DETECTED
TETRAHYDROCANNABINOL: POSITIVE — AB

## 2018-06-26 MED ORDER — ACETAMINOPHEN 500 MG PO TABS
1000.0000 mg | ORAL_TABLET | Freq: Once | ORAL | Status: AC
  Administered 2018-06-26: 1000 mg via ORAL
  Filled 2018-06-26: qty 2

## 2018-06-26 NOTE — MAU Note (Signed)
Pt tried to go to sleep with several pillows propped under her back and neck and kept getting short of breath for about 45 min. After she got up , she describes chest pressure . On the way to the hospital, she says her chest began to hurt.   Good fetal movement; denies any vag bleeding or leaking.

## 2018-06-26 NOTE — Discharge Instructions (Signed)
Research childbirth classes and hospital preregistration at [email or web address]  Fetal Movement Counts Patient Name: ________________________________________________ Patient Due Date: ____________________ What is a fetal movement count? A fetal movement count is the number of times that you feel your baby move during a certain amount of time. This may also be called a fetal kick count. A fetal movement count is recommended for every pregnant woman. You may be asked to start counting fetal movements as early as week 28 of your pregnancy. Pay attention to when your baby is most active. You may notice your baby's sleep and wake cycles. You may also notice things that make your baby move more. You should do a fetal movement count:  When your baby is normally most active.  At the same time each day.  A good time to count movements is while you are resting, after having something to eat and drink. How do I count fetal movements? 1. Find a quiet, comfortable area. Sit, or lie down on your side. 2. Write down the date, the start time and stop time, and the number of movements that you felt between those two times. Take this information with you to your health care visits. 3. For 2 hours, count kicks, flutters, swishes, rolls, and jabs. You should feel at least 10 movements during 2 hours. 4. You may stop counting after you have felt 10 movements. 5. If you do not feel 10 movements in 2 hours, have something to eat and drink. Then, keep resting and counting for 1 hour. If you feel at least 4 movements during that hour, you may stop counting. Contact a health care provider if:  You feel fewer than 4 movements in 2 hours.  Your baby is not moving like he or she usually does. Date: ____________ Start time: ____________ Stop time: ____________ Movements: ____________ Date: ____________ Start time: ____________ Stop time: ____________ Movements: ____________ Date: ____________ Start time: ____________  Stop time: ____________ Movements: ____________ Date: ____________ Start time: ____________ Stop time: ____________ Movements: ____________ Date: ____________ Start time: ____________ Stop time: ____________ Movements: ____________ Date: ____________ Start time: ____________ Stop time: ____________ Movements: ____________ Date: ____________ Start time: ____________ Stop time: ____________ Movements: ____________ Date: ____________ Start time: ____________ Stop time: ____________ Movements: ____________ Date: ____________ Start time: ____________ Stop time: ____________ Movements: ____________ This information is not intended to replace advice given to you by your health care provider. Make sure you discuss any questions you have with your health care provider. Document Released: 09/29/2006 Document Revised: 04/28/2016 Document Reviewed: 10/09/2015 Elsevier Interactive Patient Education  2018 [employer]  Braxton Hicks Contractions Contractions of the uterus can occur throughout pregnancy, but they are not always a sign that you are in labor. You may have practice contractions called Braxton Hicks contractions. These false labor contractions are sometimes confused with true labor. What are Braxton Hicks contractions? Braxton Hicks contractions are tightening movements that occur in the muscles of the uterus before labor. Unlike true labor contractions, these contractions do not result in opening (dilation) and thinning of the cervix. Toward the end of pregnancy (32-34 weeks), Braxton Hicks contractions can happen more often and may become stronger. These contractions are sometimes difficult to tell apart from true labor because they can be very uncomfortable. You should not feel embarrassed if you go to the hospital with false labor. Sometimes, the only way to tell if you are in true labor is for your health care provider to look for changes in the cervix. The health care provider will   do a physical  exam and may monitor your contractions. If you are not in true labor, the exam should show that your cervix is not dilating and your water has not broken. If there are other health problems associated with your pregnancy, it is completely safe for you to be sent home with false labor. You may continue to have Braxton Hicks contractions until you go into true labor. How to tell the difference between true labor and false labor True labor  Contractions last 30-70 seconds.  Contractions become very regular.  Discomfort is usually felt in the top of the uterus, and it spreads to the lower abdomen and low back.  Contractions do not go away with walking.  Contractions usually become more intense and increase in frequency.  The cervix dilates and gets thinner. False labor  Contractions are usually shorter and not as strong as true labor contractions.  Contractions are usually irregular.  Contractions are often felt in the front of the lower abdomen and in the groin.  Contractions may go away when you walk around or change positions while lying down.  Contractions get weaker and are shorter-lasting as time goes on.  The cervix usually does not dilate or become thin. Follow these instructions at home:  Take over-the-counter and prescription medicines only as told by your health care provider.  Keep up with your usual exercises and follow other instructions from your health care provider.  Eat and drink lightly if you think you are going into labor.  If Braxton Hicks contractions are making you uncomfortable: ? Change your position from lying down or resting to walking, or change from walking to resting. ? Sit and rest in a tub of warm water. ? Drink enough fluid to keep your urine pale yellow. Dehydration may cause these contractions. ? Do slow and deep breathing several times an hour.  Keep all follow-up prenatal visits as told by your health care provider. This is  important. Contact a health care provider if:  You have a fever.  You have continuous pain in your abdomen. Get help right away if:  Your contractions become stronger, more regular, and closer together.  You have fluid leaking or gushing from your vagina.  You pass blood-tinged mucus (bloody show).  You have bleeding from your vagina.  You have low back pain that you never had before.  You feel your baby's head pushing down and causing pelvic pressure.  Your baby is not moving inside you as much as it used to. Summary  Contractions that occur before labor are called Braxton Hicks contractions, false labor, or practice contractions.  Braxton Hicks contractions are usually shorter, weaker, farther apart, and less regular than true labor contractions. True labor contractions usually become progressively stronger and regular and they become more frequent.  Manage discomfort from Braxton Hicks contractions by changing position, resting in a warm bath, drinking plenty of water, or practicing deep breathing. This information is not intended to replace advice given to you by your health care provider. Make sure you discuss any questions you have with your health care provider. Document Released: 01/13/2017 Document Revised: 01/13/2017 Document Reviewed: 01/13/2017 Elsevier Interactive Patient Education  2018 [employer]    

## 2018-06-26 NOTE — MAU Provider Note (Signed)
History     CSN: [id number]  Arrival date and time: 06/26/18 0110   First Provider Initiated Contact with Patient 06/26/18 0208      Chief Complaint  Patient presents with  . Chest Pain   HPI  Hannah Bradford is a 22 y.o. G1P0 at [redacted]w[redacted]d who presents to MAU with chief complaints of chest pain and lightheadedness. Denies vaginal bleeding, leaking of fluid, decreased fetal movement, fever, falls, or recent illness.    Chest pain This is a new problem, onset last night. Patient c/o chest pain and pressure 4/10. Does not radiate, no aggravating or alleviating factors. Patient denies chest pain at time of Provider introduction.  OB History    Gravida  1   Para      Term      Preterm      AB      Living        SAB      TAB      Ectopic      Multiple      Live Births              Past Medical History:  Diagnosis Date  . Medical history non-contributory     Past Surgical History:  Procedure Laterality Date  . NO PAST SURGERIES      Family History  Problem Relation Age of Onset  . Hypertension Maternal Grandmother   . Hypertension Paternal Grandmother   . Diabetes Paternal Grandmother     Social History   Tobacco Use  . Smoking status: Former Smoker    Types: Cigarettes    Last attempt to quit: 2019    Years since quitting: 0.7  . Smokeless tobacco: Never Used  Substance Use Topics  . Alcohol use: Never    Frequency: Never  . Drug use: Not Currently    Types: Marijuana    Comment: quit when pt found out she was pregnant    Allergies: No Known Allergies  Medications Prior to Admission  Medication Sig Dispense Refill Last Dose  . Prenatal Vit-Fe Phos-FA-Omega (VITAFOL GUMMIES) 3.33-0.333-34.8 MG CHEW Chew 3 each by mouth daily.   Past Week at Unknown time  . Doxylamine-Pyridoxine ER (BONJESTA) 20-20 MG TBCR Take 1 tablet by mouth 2 (two) times daily. Take at bedtime and also in AM if needed (Patient not taking: Reported on 04/19/2018) 60  tablet 4 Not Taking  . hydrocortisone cream 1 % Apply 1 application topically 2 (two) times daily. 30 g 0 not taking  . hydrOXYzine (ATARAX/VISTARIL) 25 MG tablet Take 1 tablet (25 mg total) by mouth every 6 (six) hours as needed for itching. 30 tablet 2 not taking  . nystatin ointment (MYCOSTATIN) Apply 1 application topically 2 (two) times daily. 30 g 1 not taking    Review of Systems  Constitutional: Negative for diaphoresis, fatigue and fever.  Respiratory: Negative for apnea, cough, choking, chest tightness, shortness of breath, wheezing and stridor.   Cardiovascular: Positive for chest pain.  Gastrointestinal: Negative for abdominal pain, nausea and vomiting.  Genitourinary: Negative for difficulty urinating, dyspareunia, vaginal bleeding, vaginal discharge and vaginal pain.  Musculoskeletal: Negative for back pain.  Neurological: Positive for light-headedness. Negative for dizziness, seizures, syncope, weakness, numbness and headaches.  All other systems reviewed and are negative.  Physical Exam   Blood pressure 118/79, pulse (!) 102, temperature 98.2 F (36.8 C), temperature source Oral, resp. rate 16, height 5\' 3"  (1.6 m), weight 64.9 kg, last menstrual period 12/13/2017, SpO2  98 %.  Physical Exam  Nursing note and vitals reviewed. Constitutional: She is oriented to person, place, and time. She appears well-developed and well-nourished.  Cardiovascular: Normal rate.  Respiratory: Effort normal and breath sounds normal. No respiratory distress. She has no wheezes. She has no rales. She exhibits no tenderness.  GI:  Gravid  Neurological: She is alert and oriented to person, place, and time. She has normal reflexes.  Skin: Skin is warm and dry.  Psychiatric: She has a normal mood and affect. Her behavior is normal. Judgment and thought content normal.    MAU Course  Procedures  MDM  EKG reads sinus tachycardia with non specific T wave, discussed with Dr. Earlene Plater prior to  discharge Low suspicion for cardiac issue based on patient denial of chest pain at time of introduction, no history of cardiac issues Reactive fetal tracing: baseline 125, moderate variability, positive accelerations, no decelerations Toco: uterine irritability Headache resolved at time of discharge   Patient Vitals for the past 24 hrs:  BP Temp Temp src Pulse Resp SpO2 Height Weight  06/26/18 0203 118/79 98.2 F (36.8 C) Oral (!) 102 16 - - -  06/26/18 0200 - - - - - 98 % - -  06/26/18 0155 - - - - - 99 % - -  06/26/18 0150 - - - - - 99 % - -  06/26/18 0145 - - - - - 99 % - -  06/26/18 0127 122/73 98 F (36.7 C) - (!) 107 16 100 % 5\' 3"  (1.6 m) 64.9 kg    Results for orders placed or performed during the hospital encounter of 06/26/18 (from the past 24 hour(s))  Urinalysis, Routine w reflex microscopic     Status: Abnormal   Collection Time: 06/26/18  1:54 AM  Result Value Ref Range   Color, Urine YELLOW YELLOW   APPearance CLEAR CLEAR   Specific Gravity, Urine 1.020 1.005 - 1.030   pH 8.0 5.0 - 8.0   Glucose, UA NEGATIVE NEGATIVE mg/dL   Hgb urine dipstick NEGATIVE NEGATIVE   Bilirubin Urine NEGATIVE NEGATIVE   Ketones, ur NEGATIVE NEGATIVE mg/dL   Protein, ur NEGATIVE NEGATIVE mg/dL   Nitrite NEGATIVE NEGATIVE   Leukocytes, UA TRACE (A) NEGATIVE   RBC / HPF 0-5 0 - 5 RBC/hpf   WBC, UA 6-10 0 - 5 WBC/hpf   Bacteria, UA RARE (A) NONE SEEN   Squamous Epithelial / LPF 0-5 0 - 5  Urine rapid drug screen (hosp performed)     Status: Abnormal   Collection Time: 06/26/18  1:54 AM  Result Value Ref Range   Opiates NONE DETECTED NONE DETECTED   Cocaine NONE DETECTED NONE DETECTED   Benzodiazepines NONE DETECTED NONE DETECTED   Amphetamines NONE DETECTED NONE DETECTED   Tetrahydrocannabinol POSITIVE (A) NONE DETECTED   Barbiturates NONE DETECTED NONE DETECTED  CBC     Status: Abnormal   Collection Time: 06/26/18  1:59 AM  Result Value Ref Range   WBC 10.5 4.0 - 10.5 K/uL    RBC 3.41 (L) 3.87 - 5.11 MIL/uL   Hemoglobin 9.5 (L) 12.0 - 15.0 g/dL   HCT 16.1 (L) 09.6 - 04.5 %   MCV 81.5 80.0 - 100.0 fL   MCH 27.9 26.0 - 34.0 pg   MCHC 34.2 30.0 - 36.0 g/dL   RDW 40.9 81.1 - 91.4 %   Platelets 210 150 - 400 K/uL   nRBC 0.0 0.0 - 0.2 %  Comprehensive metabolic panel  Status: Abnormal   Collection Time: 06/26/18  1:59 AM  Result Value Ref Range   Sodium 135 135 - 145 mmol/L   Potassium 3.8 3.5 - 5.1 mmol/L   Chloride 104 98 - 111 mmol/L   CO2 22 22 - 32 mmol/L   Glucose, Bld 103 (H) 70 - 99 mg/dL   BUN 10 6 - 20 mg/dL   Creatinine, Ser 1.61 0.44 - 1.00 mg/dL   Calcium 8.5 (L) 8.9 - 10.3 mg/dL   Total Protein 6.2 (L) 6.5 - 8.1 g/dL   Albumin 3.0 (L) 3.5 - 5.0 g/dL   AST 20 15 - 41 U/L   ALT 11 0 - 44 U/L   Alkaline Phosphatase 79 38 - 126 U/L   Total Bilirubin 0.6 0.3 - 1.2 mg/dL   GFR calc non Af Amer >60 >60 mL/min   GFR calc Af Amer >60 >60 mL/min   Anion gap 9 5 - 15    Assessment and Plan  --22 y.o. G1P0 at [redacted]w[redacted]d  --Reactive fetal tracing --Positive THC --Discharge home in stable condition  F/U: LOB appt 06/27/2018  Calvert Cantor, CNM 06/26/2018, 3:02 AM

## 2018-06-27 ENCOUNTER — Other Ambulatory Visit: Payer: Medicaid Other

## 2018-06-27 ENCOUNTER — Encounter: Payer: Self-pay | Admitting: Obstetrics and Gynecology

## 2018-06-27 ENCOUNTER — Ambulatory Visit (INDEPENDENT_AMBULATORY_CARE_PROVIDER_SITE_OTHER): Payer: Medicaid Other | Admitting: Obstetrics and Gynecology

## 2018-06-27 VITALS — BP 124/76 | HR 101 | Wt 143.1 lb

## 2018-06-27 DIAGNOSIS — R21 Rash and other nonspecific skin eruption: Secondary | ICD-10-CM

## 2018-06-27 DIAGNOSIS — O99323 Drug use complicating pregnancy, third trimester: Secondary | ICD-10-CM

## 2018-06-27 DIAGNOSIS — Z3400 Encounter for supervision of normal first pregnancy, unspecified trimester: Secondary | ICD-10-CM | POA: Diagnosis not present

## 2018-06-27 LAB — CULTURE, OB URINE: Culture: <10000 — AB

## 2018-06-27 NOTE — Patient Instructions (Signed)
For colds and allergies  Any anti-histamine including benadryl, allegra, claritin, etc.  Sudafed but not phenylephrine  Mucinex  Robitussin  For Reflux/heartburn  Pepcid Tums Prilosec Prevacid  For yeast infections  Monistat  For constipation  Colace  For minor aches and pains  Tylenol-do not take more than 4000mg  in 24 hours. Therma-care or like heat packs

## 2018-06-27 NOTE — Progress Notes (Signed)
   PRENATAL VISIT NOTE  Subjective:  Hannah Bradford is a 22 y.o. G1P0 at [redacted]w[redacted]d being seen today for ongoing prenatal care.  She is currently monitored for the following issues for this low-risk pregnancy and has Supervision of normal first pregnancy, antepartum; Former smoker; Nausea and vomiting during pregnancy prior to [redacted] weeks gestation; Anemia; Papular eruption; and Substance abuse affecting pregnancy in third trimester, antepartum on their problem list.  Patient reports some cough/cold, congestion. Also reports   Contractions: Not present. Vag. Bleeding: None.  Movement: Present. Denies leaking of fluid.   The following portions of the patient's history were reviewed and updated as appropriate: allergies, current medications, past family history, past medical history, past social history, past surgical history and problem list. Problem list updated.  Objective:   Vitals:   06/27/18 0922  BP: 124/76  Pulse: (!) 101  Weight: 143 lb 1.6 oz (64.9 kg)    Fetal Status: Fetal Heart Rate (bpm): 138   Movement: Present     General:  Alert, oriented and cooperative. Patient is in no acute distress.  Skin: Skin is warm and dry. No rash noted.   Cardiovascular: Normal heart rate noted  Respiratory: Normal respiratory effort, no problems with respiration noted  Abdomen: Soft, gravid, appropriate for gestational age.  Pain/Pressure: Present     Pelvic: Cervical exam deferred        Extremities: Normal range of motion.  Edema: None  Mental Status: Normal mood and affect. Normal behavior. Normal judgment and thought content.   Assessment and Plan:  Pregnancy: G1P0 at [redacted]w[redacted]d  1. Supervision of normal first pregnancy, antepartum - 2 hr GTT - CBC - HIV - RPR - Flu shot next visit due to cold  2. Substance abuse affecting pregnancy in third trimester, antepartum THC  3. Papular eruption Varicella IgM/IgG negative Reports eruption is getting better, much improved, no longer  itching  4. Cold symptoms - reviewed safe meds to take in pregnancy   Preterm labor symptoms and general obstetric precautions including but not limited to vaginal bleeding, contractions, leaking of fluid and fetal movement were reviewed in detail with the patient. Please refer to After Visit Summary for other counseling recommendations.  Return in about 2 weeks (around 07/11/2018) for OB visit.  Future Appointments  Date Time Provider Department Center  06/27/2018 10:00 AM Conan Bowens, MD [facility] None    Conan Bowens, MD

## 2018-06-27 NOTE — Progress Notes (Signed)
Patient reports good fetal movement with occasional shoulder pressure. Pt complains of cold symptoms today, offer tdap at next visit.

## 2018-06-28 LAB — HIV ANTIBODY (ROUTINE TESTING W REFLEX): HIV Screen 4th Generation wRfx: NONREACTIVE

## 2018-06-28 LAB — CBC
HEMATOCRIT: 30 % — AB (ref 34.0–46.6)
HEMOGLOBIN: 9.6 g/dL — AB (ref 11.1–15.9)
MCH: 26.8 pg (ref 26.6–33.0)
MCHC: 32 g/dL (ref 31.5–35.7)
MCV: 84 fL (ref 79–97)
Platelets: 176 10*3/uL (ref 150–450)
RBC: 3.58 x10E6/uL — AB (ref 3.77–5.28)
RDW: 12.8 % (ref 12.3–15.4)
WBC: 9.1 10*3/uL (ref 3.4–10.8)

## 2018-06-28 LAB — RPR: RPR: NONREACTIVE

## 2018-06-28 LAB — GLUCOSE TOLERANCE, 2 HOURS W/ 1HR
GLUCOSE, 1 HOUR: 157 mg/dL (ref 65–179)
Glucose, 2 hour: 98 mg/dL (ref 65–152)
Glucose, Fasting: 80 mg/dL (ref 65–91)

## 2018-07-13 ENCOUNTER — Encounter: Payer: Self-pay | Admitting: Obstetrics and Gynecology

## 2018-07-13 ENCOUNTER — Ambulatory Visit (INDEPENDENT_AMBULATORY_CARE_PROVIDER_SITE_OTHER): Payer: Medicaid Other | Admitting: Obstetrics and Gynecology

## 2018-07-13 VITALS — BP 116/78 | HR 121 | Wt 146.5 lb

## 2018-07-13 DIAGNOSIS — R21 Rash and other nonspecific skin eruption: Secondary | ICD-10-CM

## 2018-07-13 DIAGNOSIS — Z23 Encounter for immunization: Secondary | ICD-10-CM

## 2018-07-13 DIAGNOSIS — Z3403 Encounter for supervision of normal first pregnancy, third trimester: Secondary | ICD-10-CM

## 2018-07-13 DIAGNOSIS — Z3400 Encounter for supervision of normal first pregnancy, unspecified trimester: Secondary | ICD-10-CM

## 2018-07-13 MED ORDER — FERROUS SULFATE 325 (65 FE) MG PO TABS: 325.0000 mg | ORAL_TABLET | Freq: Two times a day (BID) | ORAL | 1 refills | Status: DC

## 2018-07-13 NOTE — Progress Notes (Signed)
Pt presents for ROB c/o itchy rash. Hydroxyzine works for an hr then itching returns.  Pt feeling lightheadedness, admits to not eating this morning-crackers given for pt to eat.

## 2018-07-13 NOTE — Progress Notes (Signed)
   PRENATAL VISIT NOTE  Subjective:  Hannah Bradford is a 22 y.o. G1P0 at [redacted]w[redacted]d being seen today for ongoing prenatal care.  She is currently monitored for the following issues for this low-risk pregnancy and has Supervision of normal first pregnancy, antepartum; Former smoker; Nausea and vomiting during pregnancy prior to [redacted] weeks gestation; Anemia; Papular eruption; and Substance abuse affecting pregnancy in third trimester, antepartum on their problem list.  Patient reports she is feeling light-headed this am, thinks it is because she has not eaten.  Contractions: Not present. Vag. Bleeding: None.  Movement: Present. Denies leaking of fluid.   The following portions of the patient's history were reviewed and updated as appropriate: allergies, current medications, past family history, past medical history, past social history, past surgical history and problem list. Problem list updated.  Objective:   Vitals:   07/13/18 0934  BP: 116/78  Pulse: (!) 121  Weight: 146 lb 8 oz (66.5 kg)    Fetal Status: Fetal Heart Rate (bpm): 158   Movement: Present     General:  Alert, oriented and cooperative. Patient is in no acute distress.  Skin: Skin is warm and dry. No rash noted.   Cardiovascular: Normal heart rate noted  Respiratory: Normal respiratory effort, no problems with respiration noted  Abdomen: Soft, gravid, appropriate for gestational age.  Pain/Pressure: Present     Pelvic: Cervical exam deferred        Extremities: Normal range of motion.  Edema: None  Mental Status: Normal mood and affect. Normal behavior. Normal judgment and thought content.   Assessment and Plan:  Pregnancy: G1P0 at [redacted]w[redacted]d  1. Supervision of normal first pregnancy, antepartum Iron sent to pharmacy  2. Papular eruption States not itching as bad Improved from last visit Repeat US growth ordered  Preterm labor symptoms and general obstetric precautions including but not limited to vaginal bleeding,  contractions, leaking of fluid and fetal movement were reviewed in detail with the patient. Please refer to After Visit Summary for other counseling recommendations.  Return in about 2 weeks (around 07/27/2018) for OB visit (MD).  No future appointments.  Conan Bowens, MD

## 2018-07-15 ENCOUNTER — Encounter (HOSPITAL_COMMUNITY): Payer: Self-pay

## 2018-07-15 ENCOUNTER — Inpatient Hospital Stay (HOSPITAL_COMMUNITY)
Admission: AD | Admit: 2018-07-15 | Discharge: 2018-07-15 | Disposition: A | Discharge status: Home / Self Care | Payer: Medicaid Other | Source: Ambulatory Visit | Attending: Obstetrics and Gynecology | Admitting: Obstetrics and Gynecology

## 2018-07-15 DIAGNOSIS — O368130 Decreased fetal movements, third trimester, not applicable or unspecified: Secondary | ICD-10-CM | POA: Diagnosis not present

## 2018-07-15 DIAGNOSIS — Z3689 Encounter for other specified antenatal screening: Secondary | ICD-10-CM

## 2018-07-15 DIAGNOSIS — L509 Urticaria, unspecified: Secondary | ICD-10-CM | POA: Insufficient documentation

## 2018-07-15 DIAGNOSIS — O26893 Other specified pregnancy related conditions, third trimester: Secondary | ICD-10-CM | POA: Insufficient documentation

## 2018-07-15 DIAGNOSIS — Z3A30 30 weeks gestation of pregnancy: Secondary | ICD-10-CM

## 2018-07-15 DIAGNOSIS — Z87891 Personal history of nicotine dependence: Secondary | ICD-10-CM | POA: Insufficient documentation

## 2018-07-15 DIAGNOSIS — Z0371 Encounter for suspected problem with amniotic cavity and membrane ruled out: Secondary | ICD-10-CM

## 2018-07-15 LAB — CBC
HCT: 29.7 % — ABNORMAL LOW (ref 36.0–46.0)
HEMOGLOBIN: 9.6 g/dL — AB (ref 12.0–15.0)
MCH: 26.7 pg (ref 26.0–34.0)
MCHC: 32.3 g/dL (ref 30.0–36.0)
MCV: 82.7 fL (ref 80.0–100.0)
PLATELETS: 217 10*3/uL (ref 150–400)
RBC: 3.59 MIL/uL — ABNORMAL LOW (ref 3.87–5.11)
RDW: 12.7 % (ref 11.5–15.5)
WBC: 11.2 10*3/uL — AB (ref 4.0–10.5)
nRBC: 0 % (ref 0.0–0.2)

## 2018-07-15 LAB — URINALYSIS, ROUTINE W REFLEX MICROSCOPIC
BACTERIA UA: NONE SEEN
Bilirubin Urine: NEGATIVE
Glucose, UA: NEGATIVE mg/dL
Hgb urine dipstick: NEGATIVE
KETONES UR: 20 mg/dL — AB
Nitrite: NEGATIVE
PROTEIN: NEGATIVE mg/dL
SPECIFIC GRAVITY, URINE: 1.02 (ref 1.005–1.030)
pH: 7 (ref 5.0–8.0)

## 2018-07-15 LAB — COMPREHENSIVE METABOLIC PANEL
ALBUMIN: 2.8 g/dL — AB (ref 3.5–5.0)
ALT: 10 U/L (ref 0–44)
ANION GAP: 9 (ref 5–15)
AST: 18 U/L (ref 15–41)
Alkaline Phosphatase: 96 U/L (ref 38–126)
BUN: 10 mg/dL (ref 6–20)
CHLORIDE: 101 mmol/L (ref 98–111)
CO2: 23 mmol/L (ref 22–32)
Calcium: 9.1 mg/dL (ref 8.9–10.3)
Creatinine, Ser: 0.58 mg/dL (ref 0.44–1.00)
GFR calc Af Amer: >60 mL/min (ref >60)
GFR calc non Af Amer: >60 mL/min (ref >60)
GLUCOSE: 113 mg/dL — AB (ref 70–99)
POTASSIUM: 3.6 mmol/L (ref 3.5–5.1)
SODIUM: 133 mmol/L — AB (ref 135–145)
Total Bilirubin: 0.5 mg/dL (ref 0.3–1.2)
Total Protein: 5.9 g/dL — ABNORMAL LOW (ref 6.5–8.1)

## 2018-07-15 LAB — AMNISURE RUPTURE OF MEMBRANE (ROM) NOT AT ARMC: Amnisure ROM: NEGATIVE

## 2018-07-15 LAB — GLUCOSE, CAPILLARY: GLUCOSE-CAPILLARY: 113 mg/dL — AB (ref 70–99)

## 2018-07-15 MED ORDER — LACTATED RINGERS IV BOLUS
1000.0000 mL | Freq: Once | INTRAVENOUS | Status: AC
  Administered 2018-07-15: 1000 mL via INTRAVENOUS

## 2018-07-15 NOTE — MAU Provider Note (Signed)
History     CSN: [id number]  Arrival date and time: 07/15/18 1407   First Provider Initiated Contact with Patient 07/15/18 1450     Chief Complaint  Patient presents with  . Rash  . Decreased Fetal Movement  . Abdominal Pain  . Vaginal Discharge   HPI Hannah Bradford is a 22 y.o. G1P0 at [redacted]w[redacted]d who presents with decreased fetal movement and vaginal discharge. She states she hasn't felt the baby move since yesterday but was feeling movement since her arrival in MAU. She also reports small gushes of fluid since this morning. States the fluid is creamy white but continues to keep her underwear wet. She also reports intermittent abdominal pain that she rates a 3/10. She states she has a rash of red bumps all over her body that come and go. She states there are there some days and gone others. She denies any vaginal bleeding.   OB History    Gravida  1   Para      Term      Preterm      AB      Living        SAB      TAB      Ectopic      Multiple      Live Births              Past Medical History:  Diagnosis Date  . Medical history non-contributory     Past Surgical History:  Procedure Laterality Date  . NO PAST SURGERIES      Family History  Problem Relation Age of Onset  . Hypertension Maternal Grandmother   . Hypertension Paternal Grandmother   . Diabetes Paternal Grandmother     Social History   Tobacco Use  . Smoking status: Former Smoker    Types: Cigarettes    Last attempt to quit: 2019    Years since quitting: 0.8  . Smokeless tobacco: Never Used  Substance Use Topics  . Alcohol use: Never    Frequency: Never  . Drug use: Not Currently    Types: Marijuana    Comment: quit when pt found out she was pregnant    Allergies: No Known Allergies  Medications Prior to Admission  Medication Sig Dispense Refill Last Dose  . Doxylamine-Pyridoxine ER (BONJESTA) 20-20 MG TBCR Take 1 tablet by mouth 2 (two) times daily. Take at bedtime and  also in AM if needed (Patient not taking: Reported on 04/19/2018) 60 tablet 4 Not Taking  . ferrous sulfate (FERROUSUL) 325 (65 FE) MG tablet Take 1 tablet (325 mg total) by mouth 2 (two) times daily. 60 tablet 1   . hydrocortisone cream 1 % Apply 1 application topically 2 (two) times daily. 30 g 0 Taking  . hydrOXYzine (ATARAX/VISTARIL) 25 MG tablet Take 1 tablet (25 mg total) by mouth every 6 (six) hours as needed for itching. 30 tablet 2 Taking  . nystatin ointment (MYCOSTATIN) Apply 1 application topically 2 (two) times daily. 30 g 1 Taking  . Prenatal Vit-Fe Phos-FA-Omega (VITAFOL GUMMIES) 3.33-0.333-34.8 MG CHEW Chew 3 each by mouth daily.   Taking    Review of Systems  Respiratory: Negative for chest tightness and shortness of breath.   Gastrointestinal: Positive for abdominal pain. Negative for diarrhea and vomiting.  Genitourinary: Positive for vaginal discharge. Negative for dysuria and vaginal bleeding.  Skin: Positive for rash.  Neurological: Positive for light-headedness.  Psychiatric/Behavioral: The patient is nervous/anxious.    Physical Exam  Blood pressure 130/72, pulse (!) 149, temperature 98.2 F (36.8 C), temperature source Oral, resp. rate 16, weight 64.9 kg, last menstrual period 12/13/2017, SpO2 98 %.  Patient Vitals for the past 24 hrs:  BP Temp Temp src Pulse Resp SpO2 Weight  07/15/18 1530 - - - 100 - - -  07/15/18 1525 - - - (!) 114 - 99 % -  07/15/18 1515 - - - (!) 109 - 100 % -  07/15/18 1500 - - - - - 99 % -  07/15/18 1428 130/72 98.2 F (36.8 C) Oral (!) 149 16 98 % -  07/15/18 1421 - - - - - - 64.9 kg   Physical Exam  Nursing note and vitals reviewed. Constitutional: She is oriented to person, place, and time. She appears well-developed and well-nourished. No distress.  HENT:  Head: Normocephalic.  Eyes: Pupils are equal, round, and reactive to light.  Cardiovascular: Normal rate, regular rhythm and normal heart sounds.  Respiratory: Effort normal  and breath sounds normal. No respiratory distress.  GI: Soft. Bowel sounds are normal. She exhibits no distension. There is no tenderness.  Neurological: She is alert and oriented to person, place, and time.  Skin: Skin is warm and dry. Rash (Erythemic papules all over body) noted.  Psychiatric: She has a normal mood and affect. Her behavior is normal. Judgment and thought content normal.    Fetal Tracing:  Baseline: 125 Variability: moderate Accels: 15x15 Decels: none  Toco: occasional uc's  Dilation: Closed Effacement (%): Thick Cervical Position: Posterior Exam by:: neill cnm   MAU Course  Procedures Results for orders placed or performed during the hospital encounter of 07/15/18 (from the past 24 hour(s))  Urinalysis, Routine w reflex microscopic     Status: Abnormal   Collection Time: 07/15/18  2:42 PM  Result Value Ref Range   Color, Urine YELLOW YELLOW   APPearance HAZY (A) CLEAR   Specific Gravity, Urine 1.020 1.005 - 1.030   pH 7.0 5.0 - 8.0   Glucose, UA NEGATIVE NEGATIVE mg/dL   Hgb urine dipstick NEGATIVE NEGATIVE   Bilirubin Urine NEGATIVE NEGATIVE   Ketones, ur 20 (A) NEGATIVE mg/dL   Protein, ur NEGATIVE NEGATIVE mg/dL   Nitrite NEGATIVE NEGATIVE   Leukocytes, UA TRACE (A) NEGATIVE   RBC / HPF 0-5 0 - 5 RBC/hpf   WBC, UA 0-5 0 - 5 WBC/hpf   Bacteria, UA NONE SEEN NONE SEEN   Squamous Epithelial / LPF 6-10 0 - 5   Mucus PRESENT   Glucose, capillary     Status: Abnormal   Collection Time: 07/15/18  2:44 PM  Result Value Ref Range   Glucose-Capillary 113 (H) 70 - 99 mg/dL  CBC     Status: Abnormal   Collection Time: 07/15/18  3:03 PM  Result Value Ref Range   WBC 11.2 (H) 4.0 - 10.5 K/uL   RBC 3.59 (L) 3.87 - 5.11 MIL/uL   Hemoglobin 9.6 (L) 12.0 - 15.0 g/dL   HCT 78.2 (L) 95.6 - 21.3 %   MCV 82.7 80.0 - 100.0 fL   MCH 26.7 26.0 - 34.0 pg   MCHC 32.3 30.0 - 36.0 g/dL   RDW 08.6 57.8 - 46.9 %   Platelets 217 150 - 400 K/uL   nRBC 0.0 0.0 - 0.2 %   Comprehensive metabolic panel     Status: Abnormal   Collection Time: 07/15/18  3:03 PM  Result Value Ref Range   Sodium 133 (L) 135 -  145 mmol/L   Potassium 3.6 3.5 - 5.1 mmol/L   Chloride 101 98 - 111 mmol/L   CO2 23 22 - 32 mmol/L   Glucose, Bld 113 (H) 70 - 99 mg/dL   BUN 10 6 - 20 mg/dL   Creatinine, Ser 1.30 0.44 - 1.00 mg/dL   Calcium 9.1 8.9 - 86.5 mg/dL   Total Protein 5.9 (L) 6.5 - 8.1 g/dL   Albumin 2.8 (L) 3.5 - 5.0 g/dL   AST 18 15 - 41 U/L   ALT 10 0 - 44 U/L   Alkaline Phosphatase 96 38 - 126 U/L   Total Bilirubin 0.5 0.3 - 1.2 mg/dL   GFR calc non Af Amer >60 >60 mL/min   GFR calc Af Amer >60 >60 mL/min   Anion gap 9 5 - 15  Amnisure rupture of membrane (rom)not at [facility]     Status: None   Collection Time: 07/15/18  3:23 PM  Result Value Ref Range   Amnisure ROM NEGATIVE    MDM Upon arrival to MAU, patient breathing quickly stating she felt hot and sweaty like she may pass out. Pulse 149 bpm and respiratory rate 24. Patient placed in lateral position and encouraged slow breathing. Pulse returned to normal range and patient reports feeling better. Patient states she has a history of anxiety that is worse when she comes to the hospital.   UA CBG LR bolus CBC, CMP Amnisure NST- reactive, patient reports feeling normal fetal movement No signs or symptoms of preterm labor at this time. Rash likely stress induced hives  Assessment and Plan   1. Decreased fetal movements in third trimester, single or unspecified fetus   2. NST (non-stress test) reactive   3. [redacted] weeks gestation of pregnancy   4. Encounter for suspected premature rupture of amniotic membranes, with rupture of membranes not found   5. Hives    -Discharge home in stable condition -Encouraged patient to take vistaril or benedryl for hives -Fetal kick count precautions discussed -Patient advised to follow-up with [facility] as scheduled for prenatal care -Patient may return to MAU as needed or if her  condition were to change or worsen   Rolm Bookbinder CNM 07/15/2018, 3:27 PM

## 2018-07-15 NOTE — MAU Note (Signed)
Tauriel Keltner is a 22 y.o. at [redacted]w[redacted]d here in MAU reporting: +decreased fetal movement Reports a puddle of discharge in her underwear Lower right abdominal pain. Worse with walking States she is still broken out with bumps all over body. Pain score: 3/10 Today's Vitals   07/15/18 1421 07/15/18 1428  BP:  130/72  Pulse:  (!) 149  Resp:  16  Temp:  98.2 F (36.8 C)  TempSrc:  Oral  SpO2:  98%  Weight: 64.9 kg    Body mass index is 25.33 kg/m.  FHT:145 Lab orders placed from triage: ua

## 2018-07-15 NOTE — Discharge Instructions (Signed)

## 2018-07-27 ENCOUNTER — Encounter: Payer: Self-pay | Admitting: Obstetrics and Gynecology

## 2018-07-27 ENCOUNTER — Ambulatory Visit (INDEPENDENT_AMBULATORY_CARE_PROVIDER_SITE_OTHER): Payer: Medicaid Other | Admitting: Obstetrics and Gynecology

## 2018-07-27 ENCOUNTER — Ambulatory Visit (HOSPITAL_COMMUNITY)
Admission: RE | Admit: 2018-07-27 | Discharge: 2018-07-27 | Disposition: A | Discharge status: Home / Self Care | Payer: Medicaid Other | Source: Ambulatory Visit | Attending: Obstetrics and Gynecology | Admitting: Obstetrics and Gynecology

## 2018-07-27 VITALS — BP 105/72 | HR 106 | Wt 148.1 lb

## 2018-07-27 DIAGNOSIS — Z362 Encounter for other antenatal screening follow-up: Secondary | ICD-10-CM | POA: Diagnosis not present

## 2018-07-27 DIAGNOSIS — O99320 Drug use complicating pregnancy, unspecified trimester: Secondary | ICD-10-CM

## 2018-07-27 DIAGNOSIS — Z3403 Encounter for supervision of normal first pregnancy, third trimester: Secondary | ICD-10-CM

## 2018-07-27 DIAGNOSIS — R21 Rash and other nonspecific skin eruption: Secondary | ICD-10-CM

## 2018-07-27 DIAGNOSIS — O99323 Drug use complicating pregnancy, third trimester: Secondary | ICD-10-CM | POA: Insufficient documentation

## 2018-07-27 DIAGNOSIS — F1910 Other psychoactive substance abuse, uncomplicated: Secondary | ICD-10-CM

## 2018-07-27 DIAGNOSIS — Z3A32 32 weeks gestation of pregnancy: Secondary | ICD-10-CM

## 2018-07-27 DIAGNOSIS — Z3400 Encounter for supervision of normal first pregnancy, unspecified trimester: Secondary | ICD-10-CM

## 2018-07-27 NOTE — Progress Notes (Signed)
   PRENATAL VISIT NOTE  Subjective:  Hannah Bradford is a 22 y.o. G1P0 at 7141w2d being seen today for ongoing prenatal care.  She is currently monitored for the following issues for this low-risk pregnancy and has Supervision of normal first pregnancy, antepartum; Former smoker; Nausea and vomiting during pregnancy prior to [redacted] weeks gestation; Anemia; Papular eruption; and Substance abuse affecting pregnancy in third trimester, antepartum on their problem list.  Patient reports itching has significantly improved, only taking vistaril at night now. Patient reports some SOB with exertion, occasional chest pain with SOB that improves when she rests. Contractions: Not present. Vag. Bleeding: None.  Movement: Present. Denies leaking of fluid.   The following portions of the patient's history were reviewed and updated as appropriate: allergies, current medications, past family history, past medical history, past social history, past surgical history and problem list. Problem list updated.  Objective:   Vitals:   07/27/18 1309  BP: 105/72  Pulse: (!) 106  Weight: 148 lb 1.6 oz (67.2 kg)    Fetal Status: Fetal Heart Rate (bpm): 132   Movement: Present     General:  Alert, oriented and cooperative. Patient is in no acute distress.  Skin: Skin is warm and dry. No rash noted.   Cardiovascular: Normal heart rate noted  Respiratory: Normal respiratory effort, no problems with respiration noted  Abdomen: Soft, gravid, appropriate for gestational age.  Pain/Pressure: Present     Pelvic: Cervical exam deferred        Extremities: Normal range of motion.  Edema: None  Mental Status: Normal mood and affect. Normal behavior. Normal judgment and thought content.   Assessment and Plan:  Pregnancy: G1P0 at 841w2d  1. Supervision of normal first pregnancy, antepartum Counseled regarding risks/benefits of flu vaccine, patient declines vaccine.   2. Substance abuse affecting pregnancy in third trimester,  antepartum THC 06/2018  3. Papular eruption Improved, only requiring vistaril at night Will repeat LFT today   Preterm labor symptoms and general obstetric precautions including but not limited to vaginal bleeding, contractions, leaking of fluid and fetal movement were reviewed in detail with the patient. Please refer to After Visit Summary for other counseling recommendations.  Return in about 2 weeks (around 08/10/2018) for OB visit.  No future appointments.  Conan BowensKelly M Jabes Primo, MD

## 2018-07-27 NOTE — Progress Notes (Signed)
Pt is here for ROB. G1P0 [redacted]w[redacted]d.  

## 2018-07-28 LAB — COMPREHENSIVE METABOLIC PANEL
A/G RATIO: 1.3 (ref 1.2–2.2)
ALBUMIN: 3.6 g/dL (ref 3.5–5.5)
ALT: 11 IU/L (ref 0–32)
AST: 15 IU/L (ref 0–40)
Alkaline Phosphatase: 134 IU/L — ABNORMAL HIGH (ref 39–117)
BUN / CREAT RATIO: 16 (ref 9–23)
BUN: 9 mg/dL (ref 6–20)
Bilirubin Total: <0.2 mg/dL (ref 0.0–1.2)
CALCIUM: 9.4 mg/dL (ref 8.7–10.2)
CHLORIDE: 98 mmol/L (ref 96–106)
CO2: 24 mmol/L (ref 20–29)
CREATININE: 0.57 mg/dL (ref 0.57–1.00)
GFR calc Af Amer: 153 mL/min/{1.73_m2} (ref >59)
GFR calc non Af Amer: 133 mL/min/{1.73_m2} (ref >59)
GLOBULIN, TOTAL: 2.8 g/dL (ref 1.5–4.5)
Glucose: 92 mg/dL (ref 65–99)
Potassium: 3.8 mmol/L (ref 3.5–5.2)
Sodium: 136 mmol/L (ref 134–144)
TOTAL PROTEIN: 6.4 g/dL (ref 6.0–8.5)

## 2018-08-05 ENCOUNTER — Inpatient Hospital Stay (HOSPITAL_COMMUNITY)
Admission: AD | Admit: 2018-08-05 | Discharge: 2018-08-05 | Disposition: A | Discharge status: Home / Self Care | Payer: Medicaid Other | Source: Ambulatory Visit | Attending: Obstetrics and Gynecology | Admitting: Obstetrics and Gynecology

## 2018-08-05 ENCOUNTER — Inpatient Hospital Stay (HOSPITAL_COMMUNITY): Payer: Medicaid Other

## 2018-08-05 ENCOUNTER — Encounter (HOSPITAL_COMMUNITY): Payer: Self-pay

## 2018-08-05 DIAGNOSIS — O212 Late vomiting of pregnancy: Secondary | ICD-10-CM | POA: Diagnosis not present

## 2018-08-05 DIAGNOSIS — O26893 Other specified pregnancy related conditions, third trimester: Secondary | ICD-10-CM | POA: Diagnosis not present

## 2018-08-05 DIAGNOSIS — M940 Chondrocostal junction syndrome [Tietze]: Secondary | ICD-10-CM

## 2018-08-05 DIAGNOSIS — R1011 Right upper quadrant pain: Secondary | ICD-10-CM | POA: Diagnosis not present

## 2018-08-05 DIAGNOSIS — Z3A33 33 weeks gestation of pregnancy: Secondary | ICD-10-CM

## 2018-08-05 DIAGNOSIS — Z79899 Other long term (current) drug therapy: Secondary | ICD-10-CM | POA: Insufficient documentation

## 2018-08-05 DIAGNOSIS — R112 Nausea with vomiting, unspecified: Secondary | ICD-10-CM | POA: Diagnosis not present

## 2018-08-05 DIAGNOSIS — Z87891 Personal history of nicotine dependence: Secondary | ICD-10-CM | POA: Insufficient documentation

## 2018-08-05 LAB — COMPREHENSIVE METABOLIC PANEL
ALK PHOS: 114 U/L (ref 38–126)
ALT: 11 U/L (ref 0–44)
ANION GAP: 10 (ref 5–15)
AST: 16 U/L (ref 15–41)
Albumin: 2.8 g/dL — ABNORMAL LOW (ref 3.5–5.0)
BUN: 9 mg/dL (ref 6–20)
CALCIUM: 8.6 mg/dL — AB (ref 8.9–10.3)
CHLORIDE: 103 mmol/L (ref 98–111)
CO2: 21 mmol/L — AB (ref 22–32)
CREATININE: 0.5 mg/dL (ref 0.44–1.00)
Glucose, Bld: 97 mg/dL (ref 70–99)
Potassium: 3.9 mmol/L (ref 3.5–5.1)
SODIUM: 134 mmol/L — AB (ref 135–145)
Total Bilirubin: 0.3 mg/dL (ref 0.3–1.2)
Total Protein: 6.8 g/dL (ref 6.5–8.1)

## 2018-08-05 LAB — URINALYSIS, ROUTINE W REFLEX MICROSCOPIC
Bilirubin Urine: NEGATIVE
Glucose, UA: NEGATIVE mg/dL
Hgb urine dipstick: NEGATIVE
KETONES UR: NEGATIVE mg/dL
Nitrite: NEGATIVE
PROTEIN: NEGATIVE mg/dL
Specific Gravity, Urine: 1.004 — ABNORMAL LOW (ref 1.005–1.030)
pH: 7 (ref 5.0–8.0)

## 2018-08-05 LAB — CBC WITH DIFFERENTIAL/PLATELET
Basophils Absolute: 0 10*3/uL (ref 0.0–0.1)
Basophils Relative: 0 %
EOS ABS: 0.2 10*3/uL (ref 0.0–0.5)
EOS PCT: 1 %
HCT: 28.4 % — ABNORMAL LOW (ref 36.0–46.0)
Hemoglobin: 8.9 g/dL — ABNORMAL LOW (ref 12.0–15.0)
LYMPHS ABS: 1.9 10*3/uL (ref 0.7–4.0)
LYMPHS PCT: 14 %
MCH: 25.1 pg — AB (ref 26.0–34.0)
MCHC: 31.3 g/dL (ref 30.0–36.0)
MCV: 80 fL (ref 80.0–100.0)
MONO ABS: 0.7 10*3/uL (ref 0.1–1.0)
MONOS PCT: 5 %
Neutro Abs: 10.3 10*3/uL — ABNORMAL HIGH (ref 1.7–7.7)
Neutrophils Relative %: 80 %
PLATELETS: 191 10*3/uL (ref 150–400)
RBC: 3.55 MIL/uL — ABNORMAL LOW (ref 3.87–5.11)
RDW: 12.9 % (ref 11.5–15.5)
WBC: 13 10*3/uL — ABNORMAL HIGH (ref 4.0–10.5)
nRBC: 0 % (ref 0.0–0.2)

## 2018-08-05 LAB — LIPASE, BLOOD: LIPASE: 30 U/L (ref 11–51)

## 2018-08-05 LAB — AMYLASE: AMYLASE: 91 U/L (ref 28–100)

## 2018-08-05 MED ORDER — ALUM & MAG HYDROXIDE-SIMETH 200-200-20 MG/5ML PO SUSP
30.0000 mL | Freq: Once | ORAL | Status: AC
  Administered 2018-08-05: 30 mL via ORAL
  Filled 2018-08-05: qty 30

## 2018-08-05 NOTE — MAU Note (Signed)
Ongoing RUQ pain but since yesterday it has been constant, also increased N/V  Pain in RUQ is throbbing  No vag bleeding or discharge  + FM

## 2018-08-05 NOTE — Discharge Instructions (Signed)
Costochondritis Costochondritis is swelling and irritation (inflammation) of the tissue (cartilage) that connects your ribs to your breastbone (sternum). This causes pain in the front of your chest. The pain usually starts gradually and involves more than one rib. What are the causes? The exact cause of this condition is not always known. It results from stress on the cartilage where your ribs attach to your sternum. The cause of this stress could be:  Chest injury (trauma).  Exercise or activity, such as lifting.  Severe coughing.  What increases the risk? You may be at higher risk for this condition if you:  Are female.  Are 5830?22 years old.  Recently started a new exercise or work activity.  Have low levels of vitamin D.  Have a condition that makes you cough frequently.  What are the signs or symptoms? The main symptom of this condition is chest pain. The pain:  Usually starts gradually and can be sharp or dull.  Gets worse with deep breathing, coughing, or exercise.  Gets better with rest.  May be worse when you press on the sternum-rib connection (tenderness).  How is this diagnosed? This condition is diagnosed based on your symptoms, medical history, and a physical exam. Your health care provider will check for tenderness when pressing on your sternum. This is the most important finding. You may also have tests to rule out other causes of chest pain. These may include:  An imaging scan to rule out other causes.  How is this treated? This condition usually goes away on its own over time. Your health care provider may prescribe an NSAID to reduce pain and inflammation. Your health care provider may also suggest that you:  Rest and avoid activities that make pain worse.  Apply heat or cold to the area to reduce pain and inflammation.  Do exercises to stretch your chest muscles.  If these treatments do not help, your health care provider may inject a numbing  medicine at the sternum-rib connection to help relieve the pain. Follow these instructions at home:  Avoid activities that make pain worse. This includes any activities that use chest, abdominal, and side muscles.  If directed, put ice on the painful area: ? Put ice in a plastic bag. ? Place a towel between your skin and the bag. ? Leave the ice on for 20 minutes, 2-3 times a day.  If directed, apply heat to the affected area as often as told by your health care provider. Use the heat source that your health care provider recommends, such as a moist heat pack or a heating pad. ? Place a towel between your skin and the heat source. ? Leave the heat on for 20-30 minutes. ? Remove the heat if your skin turns bright red. This is especially important if you are unable to feel pain, heat, or cold. You may have a greater risk of getting burned.  Take over-the-counter and prescription medicines only as told by your health care provider.  Return to your normal activities as told by your health care provider. Ask your health care provider what activities are safe for you.  Keep all follow-up visits as told by your health care provider. This is important. Contact a health care provider if:  You have chills or a fever.  Your pain does not go away or it gets worse.  You have a cough that does not go away (is persistent). Get help right away if:  You have shortness of breath. This information  is not intended to replace advice given to you by your health care provider. Make sure you discuss any questions you have with your health care provider. Document Released: 06/09/2005 Document Revised: 03/19/2016 Document Reviewed: 12/24/2015 Elsevier Interactive Patient Education  [employer].

## 2018-08-05 NOTE — MAU Provider Note (Signed)
History     CSN: [id number]  Arrival date and time: 08/05/18 1528   First Provider Initiated Contact with Patient 08/05/18 1642      Chief Complaint  Patient presents with  . Abdominal Pain   Hannah Bradford is a 22 y.o. G1P0 at 481w4d who presents today with RUQ pain. She denies any contractions, VB or LOF. She reports normal fetal movement. Pain started yesterday after eating chicken tenders at [employer].   Last ate: 1200  Abdominal Pain  This is a new problem. The current episode started yesterday. The onset quality is sudden. The problem occurs constantly. The problem has been gradually worsening. The pain is located in the RUQ. The pain is at a severity of 10/10. The quality of the pain is sharp and burning. The abdominal pain does not radiate. Associated symptoms include constipation (last BM today ), nausea and vomiting. Pertinent negatives include no fever. Nothing aggravates the pain. The pain is relieved by nothing. She has tried nothing for the symptoms.    OB History    Gravida  1   Para      Term      Preterm      AB      Living        SAB      TAB      Ectopic      Multiple      Live Births              Past Medical History:  Diagnosis Date  . Medical history non-contributory     Past Surgical History:  Procedure Laterality Date  . NO PAST SURGERIES      Family History  Problem Relation Age of Onset  . Hypertension Maternal Grandmother   . Hypertension Paternal Grandmother   . Diabetes Paternal Grandmother     Social History   Tobacco Use  . Smoking status: Former Smoker    Types: Cigarettes    Last attempt to quit: 2019    Years since quitting: 0.8  . Smokeless tobacco: Never Used  Substance Use Topics  . Alcohol use: Never    Frequency: Never  . Drug use: Not Currently    Types: Marijuana    Comment: quit when pt found out she was pregnant    Allergies: No Known Allergies  Medications Prior to Admission   Medication Sig Dispense Refill Last Dose  . Doxylamine-Pyridoxine ER (BONJESTA) 20-20 MG TBCR Take 1 tablet by mouth 2 (two) times daily. Take at bedtime and also in AM if needed (Patient not taking: Reported on 04/19/2018) 60 tablet 4 Not Taking  . ferrous sulfate (FERROUSUL) 325 (65 FE) MG tablet Take 1 tablet (325 mg total) by mouth 2 (two) times daily. (Patient not taking: Reported on 07/27/2018) 60 tablet 1 Not Taking  . hydrocortisone cream 1 % Apply 1 application topically 2 (two) times daily. (Patient not taking: Reported on 07/27/2018) 30 g 0 Not Taking  . hydrOXYzine (ATARAX/VISTARIL) 25 MG tablet Take 1 tablet (25 mg total) by mouth every 6 (six) hours as needed for itching. 30 tablet 2 Taking  . nystatin ointment (MYCOSTATIN) Apply 1 application topically 2 (two) times daily. (Patient not taking: Reported on 07/27/2018) 30 g 1 Not Taking  . Prenatal Vit-Fe Phos-FA-Omega (VITAFOL GUMMIES) 3.33-0.333-34.8 MG CHEW Chew 3 each by mouth daily.   Taking    Review of Systems  Constitutional: Negative for chills and fever.  Gastrointestinal: Positive for abdominal pain, constipation (last  BM today ), nausea and vomiting.   Physical Exam   Blood pressure 112/63, pulse (!) 110, temperature 98.3 F (36.8 C), temperature source Oral, resp. rate 18, weight 68.3 kg, last menstrual period 12/13/2017, SpO2 96 %.  Physical Exam  Nursing note and vitals reviewed. Constitutional: She is oriented to person, place, and time. She appears well-developed and well-nourished. No distress.  HENT:  Head: Normocephalic.  Cardiovascular: Normal rate.  Respiratory: Effort normal.  GI: Soft. There is tenderness (along RUQ). There is no rebound and no guarding.  Neurological: She is alert and oriented to person, place, and time.  Skin: Skin is warm and dry.  Psychiatric: She has a normal mood and affect.    NST:  Baseline: 130 Variability: moderate Accels: 15x15 Decels: none Toco: none    Results  for orders placed or performed during the hospital encounter of 08/05/18 (from the past 24 hour(s))  Urinalysis, Routine w reflex microscopic     Status: Abnormal   Collection Time: 08/05/18  4:09 PM  Result Value Ref Range   Color, Urine STRAW (A) YELLOW   APPearance HAZY (A) CLEAR   Specific Gravity, Urine 1.004 (L) 1.005 - 1.030   pH 7.0 5.0 - 8.0   Glucose, UA NEGATIVE NEGATIVE mg/dL   Hgb urine dipstick NEGATIVE NEGATIVE   Bilirubin Urine NEGATIVE NEGATIVE   Ketones, ur NEGATIVE NEGATIVE mg/dL   Protein, ur NEGATIVE NEGATIVE mg/dL   Nitrite NEGATIVE NEGATIVE   Leukocytes, UA MODERATE (A) NEGATIVE   RBC / HPF 0-5 0 - 5 RBC/hpf   WBC, UA 6-10 0 - 5 WBC/hpf   Bacteria, UA MANY (A) NONE SEEN   Squamous Epithelial / LPF 6-10 0 - 5  CBC with Differential/Platelet     Status: Abnormal   Collection Time: 08/05/18  4:59 PM  Result Value Ref Range   WBC 13.0 (H) 4.0 - 10.5 K/uL   RBC 3.55 (L) 3.87 - 5.11 MIL/uL   Hemoglobin 8.9 (L) 12.0 - 15.0 g/dL   HCT 81.1 (L) 91.4 - 78.2 %   MCV 80.0 80.0 - 100.0 fL   MCH 25.1 (L) 26.0 - 34.0 pg   MCHC 31.3 30.0 - 36.0 g/dL   RDW 95.6 21.3 - 08.6 %   Platelets 191 150 - 400 K/uL   nRBC 0.0 0.0 - 0.2 %   Neutrophils Relative % 80 %   Neutro Abs 10.3 (H) 1.7 - 7.7 K/uL   Lymphocytes Relative 14 %   Lymphs Abs 1.9 0.7 - 4.0 K/uL   Monocytes Relative 5 %   Monocytes Absolute 0.7 0.1 - 1.0 K/uL   Eosinophils Relative 1 %   Eosinophils Absolute 0.2 0.0 - 0.5 K/uL   Basophils Relative 0 %   Basophils Absolute 0.0 0.0 - 0.1 K/uL  Comprehensive metabolic panel     Status: Abnormal   Collection Time: 08/05/18  4:59 PM  Result Value Ref Range   Sodium 134 (L) 135 - 145 mmol/L   Potassium 3.9 3.5 - 5.1 mmol/L   Chloride 103 98 - 111 mmol/L   CO2 21 (L) 22 - 32 mmol/L   Glucose, Bld 97 70 - 99 mg/dL   BUN 9 6 - 20 mg/dL   Creatinine, Ser 5.78 0.44 - 1.00 mg/dL   Calcium 8.6 (L) 8.9 - 10.3 mg/dL   Total Protein 6.8 6.5 - 8.1 g/dL   Albumin 2.8  (L) 3.5 - 5.0 g/dL   AST 16 15 - 41 U/L  ALT 11 0 - 44 U/L   Alkaline Phosphatase 114 38 - 126 U/L   Total Bilirubin 0.3 0.3 - 1.2 mg/dL   GFR calc non Af Amer >60 >60 mL/min   GFR calc Af Amer >60 >60 mL/min   Anion gap 10 5 - 15  Amylase     Status: None   Collection Time: 08/05/18  4:59 PM  Result Value Ref Range   Amylase 91 28 - 100 U/L  Lipase, blood     Status: None   Collection Time: 08/05/18  4:59 PM  Result Value Ref Range   Lipase 30 11 - 51 U/L   US Abdomen Limited Ruq  Result Date: 08/05/2018 CLINICAL DATA:  22 year old female with history of right upper quadrant abdominal pain since yesterday. Nausea and vomiting. EXAM: ULTRASOUND ABDOMEN LIMITED RIGHT UPPER QUADRANT COMPARISON:  No priors. FINDINGS: Gallbladder: No gallstones or wall thickening visualized. No sonographic Murphy sign noted by sonographer. Common bile duct: Diameter: 3.3 mm in the porta hepatis. Liver: No focal lesion identified. Within normal limits in parenchymal echogenicity. Portal vein is patent on color Doppler imaging with normal direction of blood flow towards the liver. IMPRESSION: 1. No acute findings are noted. Specifically, no gallstones or findings to suggest an acute cholecystitis at this time. Electronically Signed   By: Trudie Reed M.D.   On: 08/05/2018 18:08    MAU Course  Procedures  MDM Patient given GI cocktail. She reports minimal improvement. Patient advised that it is likely musculoskeletal. Recommend ice prn at home.   Assessment and Plan   1. Costochondritis   2. RUQ pain   3. [redacted] weeks gestation of pregnancy    DC home Comfort measures reviewed  3rd Trimester precautions  PTL precautions  Fetal kick counts RX: none  Return to MAU as needed   Follow-up Information    [facility] Follow up.   Contact information: [address] [place] [address] [phone]           Thressa Sheller 08/05/2018, 4:44 PM

## 2018-08-16 ENCOUNTER — Other Ambulatory Visit: Payer: Self-pay

## 2018-08-16 ENCOUNTER — Ambulatory Visit (INDEPENDENT_AMBULATORY_CARE_PROVIDER_SITE_OTHER): Payer: Medicaid Other | Admitting: Obstetrics and Gynecology

## 2018-08-16 ENCOUNTER — Encounter: Payer: Self-pay | Admitting: Obstetrics and Gynecology

## 2018-08-16 VITALS — BP 117/83 | HR 143 | Wt 150.3 lb

## 2018-08-16 DIAGNOSIS — Z3403 Encounter for supervision of normal first pregnancy, third trimester: Secondary | ICD-10-CM

## 2018-08-16 DIAGNOSIS — Z3A35 35 weeks gestation of pregnancy: Secondary | ICD-10-CM

## 2018-08-16 DIAGNOSIS — Z3400 Encounter for supervision of normal first pregnancy, unspecified trimester: Secondary | ICD-10-CM

## 2018-08-16 DIAGNOSIS — O99323 Drug use complicating pregnancy, third trimester: Secondary | ICD-10-CM

## 2018-08-16 MED ORDER — ZOLPIDEM TARTRATE 5 MG PO TABS: 5.0000 mg | ORAL_TABLET | Freq: Every evening | ORAL | 1 refills | Status: DC | PRN

## 2018-08-16 MED ORDER — COMFORT FIT MATERNITY SUPP MED MISC: 0 refills | Status: DC

## 2018-08-16 NOTE — Progress Notes (Signed)
ROB.  C/o having Panic Attacks at nights and Doctors visits x 8 weeks.  Insomnia x 3 months

## 2018-08-16 NOTE — Progress Notes (Signed)
   PRENATAL VISIT NOTE  Subjective:  Hannah Bradford is a 22 y.o. G1P0 at 3281w1d being seen today for ongoing prenatal care.  She is currently monitored for the following issues for this low-risk pregnancy and has Supervision of normal first pregnancy, antepartum; Former smoker; Nausea and vomiting during pregnancy prior to [redacted] weeks gestation; Anemia; Papular eruption; and Substance abuse affecting pregnancy in third trimester, antepartum on their problem list.  Patient reports insomnia and backpain.  Contractions: Not present. Vag. Bleeding: None.  Movement: Present. Denies leaking of fluid.   The following portions of the patient's history were reviewed and updated as appropriate: allergies, current medications, past family history, past medical history, past social history, past surgical history and problem list. Problem list updated.  Objective:   Vitals:   08/16/18 1046  BP: 117/83  Pulse: (!) 143  Weight: 150 lb 4.8 oz (68.2 kg)    Fetal Status: Fetal Heart Rate (bpm): 144 Fundal Height: 35 cm Movement: Present     General:  Alert, oriented and cooperative. Patient is in no acute distress.  Skin: Skin is warm and dry. No rash noted.   Cardiovascular: Normal heart rate noted  Respiratory: Normal respiratory effort, no problems with respiration noted  Abdomen: Soft, gravid, appropriate for gestational age.  Pain/Pressure: Present     Pelvic: Cervical exam deferred        Extremities: Normal range of motion.  Edema: None  Mental Status: Normal mood and affect. Normal behavior. Normal judgment and thought content.   Assessment and Plan:  Pregnancy: G1P0 at 6081w1d  1. Supervision of normal first pregnancy, antepartum Patient is doing well Cultures next visit Rx ambien provided to help with sleep rx maternity support belt provided for back pain Patient undecided on pediatrician Patient is considering birth control pills or nexplanon for contraception Patient desired planned  early induction as family members from [place] like to witness the birth. Discussed that in the absence of a favorable cervix, IOL would likely occur at 41 weeks  2. Substance abuse affecting pregnancy in third trimester, antepartum THC in October  Preterm labor symptoms and general obstetric precautions including but not limited to vaginal bleeding, contractions, leaking of fluid and fetal movement were reviewed in detail with the patient. Please refer to After Visit Summary for other counseling recommendations.  Return in about 1 week (around 08/23/2018) for ROB.  Future Appointments  Date Time Provider Department Center  08/22/2018 10:15 AM [place] BingPickens, Charlie, MD [facility] None  08/29/2018  1:45 PM Adam PhenixArnold, James G, MD [facility] None    Catalina AntiguaPeggy Lennix Rotundo, MD

## 2018-08-22 ENCOUNTER — Encounter: Payer: Medicaid Other | Admitting: Obstetrics and Gynecology

## 2018-08-22 ENCOUNTER — Encounter: Payer: Self-pay | Admitting: Obstetrics and Gynecology

## 2018-08-22 ENCOUNTER — Institutional Professional Consult (permissible substitution): Payer: Self-pay | Admitting: Pediatrics

## 2018-08-23 ENCOUNTER — Institutional Professional Consult (permissible substitution): Payer: Self-pay | Admitting: Pediatrics

## 2018-08-23 NOTE — Progress Notes (Signed)
Patient did not keep her OB appointment for 08/22/2018.  Cornelia Copaharlie Pericles Carmicheal, Jr MD Attending Center for [facility](Faculty Practice)

## 2018-08-29 ENCOUNTER — Other Ambulatory Visit (HOSPITAL_COMMUNITY)
Admission: RE | Admit: 2018-08-29 | Discharge: 2018-08-29 | Disposition: A | Discharge status: Home / Self Care | Payer: Medicaid Other | Source: Ambulatory Visit | Attending: Obstetrics & Gynecology | Admitting: Obstetrics & Gynecology

## 2018-08-29 ENCOUNTER — Ambulatory Visit (INDEPENDENT_AMBULATORY_CARE_PROVIDER_SITE_OTHER): Payer: Medicaid Other | Admitting: Obstetrics & Gynecology

## 2018-08-29 DIAGNOSIS — Z3400 Encounter for supervision of normal first pregnancy, unspecified trimester: Secondary | ICD-10-CM | POA: Diagnosis not present

## 2018-08-29 DIAGNOSIS — Z3403 Encounter for supervision of normal first pregnancy, third trimester: Secondary | ICD-10-CM

## 2018-08-29 LAB — OB RESULTS CONSOLE GC/CHLAMYDIA: Gonorrhea: NEGATIVE

## 2018-08-29 NOTE — Progress Notes (Signed)
   PRENATAL VISIT NOTE  Subjective:  Hannah Bradford is a 22 y.o. G1P0 at 1010w0d being seen today for ongoing prenatal care.  She is currently monitored for the following issues for this low-risk pregnancy and has Supervision of normal first pregnancy, antepartum; Former smoker; Nausea and vomiting during pregnancy prior to [redacted] weeks gestation; Anemia; Papular eruption; and Substance abuse affecting pregnancy in third trimester, antepartum on their problem list.  Patient reports no complaints.  Contractions: Not present. Vag. Bleeding: None.  Movement: Present. Denies leaking of fluid.   The following portions of the patient's history were reviewed and updated as appropriate: allergies, current medications, past family history, past medical history, past social history, past surgical history and problem list. Problem list updated.  Objective:   Vitals:   08/29/18 1347  BP: 107/71  Pulse: (!) 147  Weight: 156 lb (70.8 kg)    Fetal Status: Fetal Heart Rate (bpm): 147 Fundal Height: 35 cm Movement: Present  Presentation: Vertex  General:  Alert, oriented and cooperative. Patient is in no acute distress.  Skin: Skin is warm and dry. No rash noted.   Cardiovascular: Normal heart rate noted  Respiratory: Normal respiratory effort, no problems with respiration noted  Abdomen: Soft, gravid, appropriate for gestational age.  Pain/Pressure: Absent     Pelvic: Cervical exam performed Dilation: 1 Effacement (%): 40 Station: -3  Extremities: Normal range of motion.  Edema: Trace  Mental Status: Normal mood and affect. Normal behavior. Normal judgment and thought content.   Assessment and Plan:  Pregnancy: G1P0 at 8310w0d  1. Supervision of normal first pregnancy, antepartum Routine 36 week labs  Term labor symptoms and general obstetric precautions including but not limited to vaginal bleeding, contractions, leaking of fluid and fetal movement were reviewed in detail with the patient. Please  refer to After Visit Summary for other counseling recommendations.  Return in about 1 week (around 09/05/2018).  No future appointments.  Scheryl DarterJames Arnold, MD

## 2018-08-29 NOTE — Progress Notes (Signed)
Patient reports good fetal movement, denies pain. 

## 2018-08-29 NOTE — Patient Instructions (Signed)
Vaginal Delivery Vaginal delivery means that you will give birth by pushing your baby out of your birth canal (vagina). A team of health care providers will help you before, during, and after vaginal delivery. Birth experiences are unique for every woman and every pregnancy, and birth experiences vary depending on where you choose to give birth. What should I do to prepare for my baby's birth? Before your baby is born, it is important to talk with your health care provider about:  Your labor and delivery preferences. These may include: ? Medicines that you may be given. ? How you will manage your pain. This might include non-medical pain relief techniques or injectable pain relief such as epidural analgesia. ? How you and your baby will be monitored during labor and delivery. ? Who may be in the labor and delivery room with you. ? Your feelings about surgical delivery of your baby (cesarean delivery, or C-section) if this becomes necessary. ? Your feelings about receiving donated blood through an IV tube (blood transfusion) if this becomes necessary.  Whether you are able: ? To take pictures or videos of the birth. ? To eat during labor and delivery. ? To move around, walk, or change positions during labor and delivery.  What to expect after your baby is born, such as: ? Whether delayed umbilical cord clamping and cutting is offered. ? Who will care for your baby right after birth. ? Medicines or tests that may be recommended for your baby. ? Whether breastfeeding is supported in your hospital or birth center. ? How long you will be in the hospital or birth center.  How any medical conditions you have may affect your baby or your labor and delivery experience.  To prepare for your baby's birth, you should also:  Attend all of your health care visits before delivery (prenatal visits) as recommended by your health care provider. This is important.  Prepare your home for your baby's  arrival. Make sure that you have: ? Diapers. ? Baby clothing. ? Feeding equipment. ? Safe sleeping arrangements for you and your baby.  Install a car seat in your vehicle. Have your car seat checked by a certified car seat installer to make sure that it is installed safely.  Think about who will help you with your new baby at home for at least the first several weeks after delivery.  What can I expect when I arrive at the birth center or hospital? Once you are in labor and have been admitted into the hospital or birth center, your health care provider may:  Review your pregnancy history and any concerns you have.  Insert an IV tube into one of your veins. This is used to give you fluids and medicines.  Check your blood pressure, pulse, temperature, and heart rate (vital signs).  Check whether your bag of water (amniotic sac) has broken (ruptured).  Talk with you about your birth plan and discuss pain control options.  Monitoring Your health care provider may monitor your contractions (uterine monitoring) and your baby's heart rate (fetal monitoring). You may need to be monitored:  Often, but not continuously (intermittently).  All the time or for long periods at a time (continuously). Continuous monitoring may be needed if: ? You are taking certain medicines, such as medicine to relieve pain or make your contractions stronger. ? You have pregnancy or labor complications.  Monitoring may be done by:  Placing a special stethoscope or a handheld monitoring device on your abdomen to   check your baby's heartbeat, and feeling your abdomen for contractions. This method of monitoring does not continuously record your baby's heartbeat or your contractions.  Placing monitors on your abdomen (external monitors) to record your baby's heartbeat and the frequency and length of contractions. You may not have to wear external monitors all the time.  Placing monitors inside of your uterus  (internal monitors) to record your baby's heartbeat and the frequency, length, and strength of your contractions. ? Your health care provider may use internal monitors if he or she needs more information about the strength of your contractions or your baby's heart rate. ? Internal monitors are put in place by passing a thin, flexible wire through your vagina and into your uterus. Depending on the type of monitor, it may remain in your uterus or on your baby's head until birth. ? Your health care provider will discuss the benefits and risks of internal monitoring with you and will ask for your permission before inserting the monitors.  Telemetry. This is a type of continuous monitoring that can be done with external or internal monitors. Instead of having to stay in bed, you are able to move around during telemetry. Ask your health care provider if telemetry is an option for you.  Physical exam Your health care provider may perform a physical exam. This may include:  Checking whether your baby is positioned: ? With the head toward your vagina (head-down). This is most common. ? With the head toward the top of your uterus (head-up or breech). If your baby is in a breech position, your health care provider may try to turn your baby to a head-down position so you can deliver vaginally. If it does not seem that your baby can be born vaginally, your provider may recommend surgery to deliver your baby. In rare cases, you may be able to deliver vaginally if your baby is head-up (breech delivery). ? Lying sideways (transverse). Babies that are lying sideways cannot be delivered vaginally.  Checking your cervix to determine: ? Whether it is thinning out (effacing). ? Whether it is opening up (dilating). ? How low your baby has moved into your birth canal.  What are the three stages of labor and delivery?  Normal labor and delivery is divided into the following three stages: Stage 1  Stage 1 is the  longest stage of labor, and it can last for hours or days. Stage 1 includes: ? Early labor. This is when contractions may be irregular, or regular and mild. Generally, early labor contractions are more than 10 minutes apart. ? Active labor. This is when contractions get longer, more regular, more frequent, and more intense. ? The transition phase. This is when contractions happen very close together, are very intense, and may last longer than during any other part of labor.  Contractions generally feel mild, infrequent, and irregular at first. They get stronger, more frequent (about every 2-3 minutes), and more regular as you progress from early labor through active labor and transition.  Many women progress through stage 1 naturally, but you may need help to continue making progress. If this happens, your health care provider may talk with you about: ? Rupturing your amniotic sac if it has not ruptured yet. ? Giving you medicine to help make your contractions stronger and more frequent.  Stage 1 ends when your cervix is completely dilated to 4 inches (10 cm) and completely effaced. This happens at the end of the transition phase. Stage 2  Once   your cervix is completely effaced and dilated to 4 inches (10 cm), you may start to feel an urge to push. It is common for the body to naturally take a rest before feeling the urge to push, especially if you received an epidural or certain other pain medicines. This rest period may last for up to 1-2 hours, depending on your unique labor experience.  During stage 2, contractions are generally less painful, because pushing helps relieve contraction pain. Instead of contraction pain, you may feel stretching and burning pain, especially when the widest part of your baby's head passes through the vaginal opening (crowning).  Your health care provider will closely monitor your pushing progress and your baby's progress through the vagina during stage 2.  Your  health care provider may massage the area of skin between your vaginal opening and anus (perineum) or apply warm compresses to your perineum. This helps it stretch as the baby's head starts to crown, which can help prevent perineal tearing. ? In some cases, an incision may be made in your perineum (episiotomy) to allow the baby to pass through the vaginal opening. An episiotomy helps to make the opening of the vagina larger to allow more room for the baby to fit through.  It is very important to breathe and focus so your health care provider can control the delivery of your baby's head. Your health care provider may have you decrease the intensity of your pushing, to help prevent perineal tearing.  After delivery of your baby's head, the shoulders and the rest of the body generally deliver very quickly and without difficulty.  Once your baby is delivered, the umbilical cord may be cut right away, or this may be delayed for 1-2 minutes, depending on your baby's health. This may vary among health care providers, hospitals, and birth centers.  If you and your baby are healthy enough, your baby may be placed on your chest or abdomen to help maintain the baby's temperature and to help you bond with each other. Some mothers and babies start breastfeeding at this time. Your health care team will dry your baby and help keep your baby warm during this time.  Your baby may need immediate care if he or she: ? Showed signs of distress during labor. ? Has a medical condition. ? Was born too early (prematurely). ? Had a bowel movement before birth (meconium). ? Shows signs of difficulty transitioning from being inside the uterus to being outside of the uterus. If you are planning to breastfeed, your health care team will help you begin a feeding. Stage 3  The third stage of labor starts immediately after the birth of your baby and ends after you deliver the placenta. The placenta is an organ that develops  during pregnancy to provide oxygen and nutrients to your baby in the womb.  Delivering the placenta may require some pushing, and you may have mild contractions. Breastfeeding can stimulate contractions to help you deliver the placenta.  After the placenta is delivered, your uterus should tighten (contract) and become firm. This helps to stop bleeding in your uterus. To help your uterus contract and to control bleeding, your health care provider may: ? Give you medicine by injection, through an IV tube, by mouth, or through your rectum (rectally). ? Massage your abdomen or perform a vaginal exam to remove any blood clots that are left in your uterus. ? Empty your bladder by placing a thin, flexible tube (catheter) into your bladder. ? Encourage   you to breastfeed your baby. After labor is over, you and your baby will be monitored closely to ensure that you are both healthy until you are ready to go home. Your health care team will teach you how to care for yourself and your baby. This information is not intended to replace advice given to you by your health care provider. Make sure you discuss any questions you have with your health care provider. Document Released: 06/08/2008 Document Revised: 03/19/2016 Document Reviewed: 09/14/2015 Elsevier Interactive Patient Education  2018 [employer]  

## 2018-08-30 LAB — CERVICOVAGINAL ANCILLARY ONLY
CHLAMYDIA, DNA PROBE: NEGATIVE
NEISSERIA GONORRHEA: NEGATIVE

## 2018-08-31 LAB — STREP GP B NAA: Strep Gp B NAA: NEGATIVE

## 2018-09-04 ENCOUNTER — Inpatient Hospital Stay (HOSPITAL_COMMUNITY)
Admission: AD | Admit: 2018-09-04 | Discharge: 2018-09-05 | Disposition: A | Discharge status: Home / Self Care | Payer: Medicaid Other | Source: Ambulatory Visit | Attending: Obstetrics and Gynecology | Admitting: Obstetrics and Gynecology

## 2018-09-04 ENCOUNTER — Encounter (HOSPITAL_COMMUNITY): Payer: Self-pay | Admitting: *Deleted

## 2018-09-04 DIAGNOSIS — O479 False labor, unspecified: Secondary | ICD-10-CM

## 2018-09-04 DIAGNOSIS — Z3A38 38 weeks gestation of pregnancy: Secondary | ICD-10-CM

## 2018-09-04 DIAGNOSIS — O99323 Drug use complicating pregnancy, third trimester: Secondary | ICD-10-CM

## 2018-09-04 DIAGNOSIS — O471 False labor at or after 37 completed weeks of gestation: Secondary | ICD-10-CM | POA: Insufficient documentation

## 2018-09-04 NOTE — MAU Note (Signed)
Pt reports vaginal pain that started at 10:40pm tonight. States it a constant, sharp pain that has not gone away. Pt reports she has had some contractions but has not timed them tonight as they are not painful. Just feels tightening in her stomach. Pt denies vaginal bleeding or LOF. Reports good fetal movement

## 2018-09-05 NOTE — Discharge Instructions (Signed)

## 2018-09-05 NOTE — MAU Note (Signed)
I have communicated with Dr. Earlene PlaterWallace and reviewed vital signs:  Vitals:   09/05/18 0010 09/05/18 0145  BP: 127/89 131/82  Pulse: (!) 102 (!) 115  Resp: 16   Temp: 98.5 F (36.9 C)     Vaginal exam:  Dilation: 3.5 Effacement (%): 50 Cervical Position: Middle Station: -3 Presentation: Vertex Exam by:: Elie ConferK. Caroleena Paolini RN,   Also reviewed contraction pattern and that non-stress test is reactive.  It has been documented that patient is contracting every 15minutes with zero cervical change over 1 1/2 hours not indicating active labor.  Patient denies any other complaints.  Based on this report provider has given order for discharge.  A discharge order and diagnosis entered by a provider.   Labor discharge instructions reviewed with patient.

## 2018-09-06 ENCOUNTER — Inpatient Hospital Stay (HOSPITAL_COMMUNITY): Payer: Medicaid Other | Admitting: Anesthesiology

## 2018-09-06 ENCOUNTER — Inpatient Hospital Stay (HOSPITAL_COMMUNITY)
Admission: AD | Admit: 2018-09-06 | Discharge: 2018-09-09 | DRG: 805 | Disposition: A | Discharge status: Home / Self Care | Payer: Medicaid Other | Attending: Obstetrics & Gynecology | Admitting: Obstetrics & Gynecology

## 2018-09-06 ENCOUNTER — Encounter (HOSPITAL_COMMUNITY): Payer: Self-pay | Admitting: *Deleted

## 2018-09-06 ENCOUNTER — Other Ambulatory Visit: Payer: Self-pay

## 2018-09-06 DIAGNOSIS — O411230 Chorioamnionitis, third trimester, not applicable or unspecified: Secondary | ICD-10-CM | POA: Diagnosis present

## 2018-09-06 DIAGNOSIS — F1290 Cannabis use, unspecified, uncomplicated: Secondary | ICD-10-CM | POA: Diagnosis present

## 2018-09-06 DIAGNOSIS — O4292 Full-term premature rupture of membranes, unspecified as to length of time between rupture and onset of labor: Principal | ICD-10-CM | POA: Diagnosis present

## 2018-09-06 DIAGNOSIS — O9902 Anemia complicating childbirth: Secondary | ICD-10-CM | POA: Diagnosis present

## 2018-09-06 DIAGNOSIS — Z87891 Personal history of nicotine dependence: Secondary | ICD-10-CM | POA: Diagnosis not present

## 2018-09-06 DIAGNOSIS — O99323 Drug use complicating pregnancy, third trimester: Secondary | ICD-10-CM

## 2018-09-06 DIAGNOSIS — D649 Anemia, unspecified: Secondary | ICD-10-CM | POA: Diagnosis present

## 2018-09-06 DIAGNOSIS — O411290 Chorioamnionitis, unspecified trimester, not applicable or unspecified: Secondary | ICD-10-CM | POA: Diagnosis not present

## 2018-09-06 DIAGNOSIS — O4202 Full-term premature rupture of membranes, onset of labor within 24 hours of rupture: Secondary | ICD-10-CM | POA: Diagnosis not present

## 2018-09-06 DIAGNOSIS — O99324 Drug use complicating childbirth: Secondary | ICD-10-CM | POA: Diagnosis present

## 2018-09-06 DIAGNOSIS — Z3A38 38 weeks gestation of pregnancy: Secondary | ICD-10-CM

## 2018-09-06 DIAGNOSIS — Z3A00 Weeks of gestation of pregnancy not specified: Secondary | ICD-10-CM | POA: Diagnosis not present

## 2018-09-06 LAB — CBC
HCT: 31.8 % — ABNORMAL LOW (ref 36.0–46.0)
Hemoglobin: 9.8 g/dL — ABNORMAL LOW (ref 12.0–15.0)
MCH: 23.8 pg — ABNORMAL LOW (ref 26.0–34.0)
MCHC: 30.8 g/dL (ref 30.0–36.0)
MCV: 77.4 fL — ABNORMAL LOW (ref 80.0–100.0)
NRBC: 0 % (ref 0.0–0.2)
Platelets: 211 10*3/uL (ref 150–400)
RBC: 4.11 MIL/uL (ref 3.87–5.11)
RDW: 14.3 % (ref 11.5–15.5)
WBC: 15.1 10*3/uL — ABNORMAL HIGH (ref 4.0–10.5)

## 2018-09-06 LAB — RAPID URINE DRUG SCREEN, HOSP PERFORMED
Amphetamines: NOT DETECTED
Barbiturates: NOT DETECTED
Benzodiazepines: NOT DETECTED
Cocaine: NOT DETECTED
Opiates: NOT DETECTED
Tetrahydrocannabinol: NOT DETECTED

## 2018-09-06 LAB — TYPE AND SCREEN
ABO/RH(D): O POS
ANTIBODY SCREEN: NEGATIVE

## 2018-09-06 MED ORDER — LIDOCAINE HCL (PF) 1 % IJ SOLN
INTRAMUSCULAR | Status: DC | PRN
  Administered 2018-09-06 (×2): 4 mL via EPIDURAL

## 2018-09-06 MED ORDER — SOD CITRATE-CITRIC ACID 500-334 MG/5ML PO SOLN: 30.0000 mL | ORAL | Status: DC | PRN

## 2018-09-06 MED ORDER — LACTATED RINGERS IV SOLN
500.0000 mL | INTRAVENOUS | Status: DC | PRN
  Administered 2018-09-06 – 2018-09-07 (×2): 500 mL via INTRAVENOUS

## 2018-09-06 MED ORDER — OXYTOCIN 40 UNITS IN LACTATED RINGERS INFUSION - SIMPLE MED
2.5000 [IU]/h | INTRAVENOUS | Status: DC
  Filled 2018-09-06: qty 1000

## 2018-09-06 MED ORDER — LIDOCAINE HCL (PF) 1 % IJ SOLN
30.0000 mL | INTRAMUSCULAR | Status: DC | PRN
  Filled 2018-09-06: qty 30

## 2018-09-06 MED ORDER — PHENYLEPHRINE 40 MCG/ML (10ML) SYRINGE FOR IV PUSH (FOR BLOOD PRESSURE SUPPORT)
80.0000 ug | PREFILLED_SYRINGE | INTRAVENOUS | Status: DC | PRN
  Filled 2018-09-06: qty 10

## 2018-09-06 MED ORDER — PHENYLEPHRINE 40 MCG/ML (10ML) SYRINGE FOR IV PUSH (FOR BLOOD PRESSURE SUPPORT)
80.0000 ug | PREFILLED_SYRINGE | INTRAVENOUS | Status: DC | PRN
  Filled 2018-09-06 (×2): qty 10

## 2018-09-06 MED ORDER — OXYCODONE-ACETAMINOPHEN 5-325 MG PO TABS: 2.0000 | ORAL_TABLET | ORAL | Status: DC | PRN

## 2018-09-06 MED ORDER — EPHEDRINE 5 MG/ML INJ
10.0000 mg | INTRAVENOUS | Status: DC | PRN
  Filled 2018-09-06: qty 2

## 2018-09-06 MED ORDER — TERBUTALINE SULFATE 1 MG/ML IJ SOLN
0.2500 mg | Freq: Once | INTRAMUSCULAR | Status: DC | PRN
  Filled 2018-09-06: qty 1

## 2018-09-06 MED ORDER — DIPHENHYDRAMINE HCL 50 MG/ML IJ SOLN: 12.5000 mg | INTRAMUSCULAR | Status: DC | PRN

## 2018-09-06 MED ORDER — FENTANYL 2.5 MCG/ML BUPIVACAINE 1/10 % EPIDURAL INFUSION (WH - ANES)
14.0000 mL/h | INTRAMUSCULAR | Status: DC | PRN
  Administered 2018-09-06 – 2018-09-07 (×2): 14 mL/h via EPIDURAL
  Filled 2018-09-06 (×2): qty 100

## 2018-09-06 MED ORDER — OXYCODONE-ACETAMINOPHEN 5-325 MG PO TABS: 1.0000 | ORAL_TABLET | ORAL | Status: DC | PRN

## 2018-09-06 MED ORDER — LACTATED RINGERS IV SOLN
INTRAVENOUS | Status: DC
  Administered 2018-09-06 – 2018-09-07 (×5): via INTRAVENOUS

## 2018-09-06 MED ORDER — FENTANYL CITRATE (PF) 100 MCG/2ML IJ SOLN
100.0000 ug | INTRAMUSCULAR | Status: DC | PRN
  Administered 2018-09-06 (×2): 100 ug via INTRAVENOUS
  Filled 2018-09-06 (×2): qty 2

## 2018-09-06 MED ORDER — ONDANSETRON HCL 4 MG/2ML IJ SOLN
4.0000 mg | Freq: Four times a day (QID) | INTRAMUSCULAR | Status: DC | PRN
  Administered 2018-09-07: 4 mg via INTRAVENOUS
  Filled 2018-09-06: qty 2

## 2018-09-06 MED ORDER — OXYTOCIN 40 UNITS IN LACTATED RINGERS INFUSION - SIMPLE MED
1.0000 m[IU]/min | INTRAVENOUS | Status: DC
  Administered 2018-09-06: 2 m[IU]/min via INTRAVENOUS
  Administered 2018-09-06: 4 m[IU]/min via INTRAVENOUS
  Administered 2018-09-06: 6 m[IU]/min via INTRAVENOUS
  Administered 2018-09-07: 8 m[IU]/min via INTRAVENOUS

## 2018-09-06 MED ORDER — OXYTOCIN BOLUS FROM INFUSION: 500.0000 mL | Freq: Once | INTRAVENOUS | Status: DC

## 2018-09-06 MED ORDER — LACTATED RINGERS IV SOLN
500.0000 mL | Freq: Once | INTRAVENOUS | Status: AC
  Administered 2018-09-06: 500 mL via INTRAVENOUS

## 2018-09-06 MED ORDER — ACETAMINOPHEN 325 MG PO TABS: 650.0000 mg | ORAL_TABLET | ORAL | Status: DC | PRN

## 2018-09-06 NOTE — Anesthesia Procedure Notes (Signed)
Epidural Patient location during procedure: OB  Staffing Anesthesiologist: Haizel Gatchell, MD Performed: anesthesiologist   Preanesthetic Checklist Completed: patient identified, pre-op evaluation, timeout performed, IV checked, risks and benefits discussed and monitors and equipment checked  Epidural Patient position: sitting Prep: site prepped and draped and DuraPrep Patient monitoring: heart rate, continuous pulse ox and blood pressure Approach: midline Location: L2-L3 Injection technique: LOR air and LOR saline  Needle:  Needle type: Tuohy  Needle gauge: 17 G Needle length: 9 cm Needle insertion depth: 5 cm Catheter type: closed end flexible Catheter size: 19 Gauge Catheter at skin depth: 10 cm Test dose: negative  Assessment Sensory level: T8 Events: blood not aspirated, injection not painful, no injection resistance, negative IV test and no paresthesia  Additional Notes Reason for block:procedure for pain     

## 2018-09-06 NOTE — Anesthesia Preprocedure Evaluation (Signed)
Anesthesia Evaluation  Patient identified by MRN, date of birth, ID band Patient awake    Reviewed: Allergy & Precautions, NPO status , Patient's Chart, lab work & pertinent test results  Airway Mallampati: II  TM Distance: >3 FB Neck ROM: Full    Dental  (+) Dental Advisory Given   Pulmonary neg pulmonary ROS, former smoker,    Pulmonary exam normal breath sounds clear to auscultation       Cardiovascular negative cardio ROS Normal cardiovascular exam Rhythm:Regular Rate:Normal     Neuro/Psych negative neurological ROS  negative psych ROS   GI/Hepatic negative GI ROS, Neg liver ROS,   Endo/Other  negative endocrine ROS  Renal/GU negative Renal ROS     Musculoskeletal negative musculoskeletal ROS (+)   Abdominal   Peds  Hematology  (+) Blood dyscrasia, anemia ,   Anesthesia Other Findings   Reproductive/Obstetrics (+) Pregnancy                             Anesthesia Physical Anesthesia Plan  ASA: II  Anesthesia Plan: Epidural   Post-op Pain Management:    Induction:   PONV Risk Score and Plan:   Airway Management Planned:   Additional Equipment:   Intra-op Plan:   Post-operative Plan:   Informed Consent: I have reviewed the patients History and Physical, chart, labs and discussed the procedure including the risks, benefits and alternatives for the proposed anesthesia with the patient or authorized representative who has indicated his/her understanding and acceptance.     Plan Discussed with:   Anesthesia Plan Comments:         Anesthesia Quick Evaluation

## 2018-09-06 NOTE — H&P (Signed)
LABOR AND DELIVERY ADMISSION HISTORY AND PHYSICAL NOTE  Hannah Bradford is a 22 y.o. female G1P0 with IUP at 4554w1d by LMP=6w US presenting for contractions. PROM at 1245 in MAU.  She reports positive fetal movement. She denies leakage of fluid or vaginal bleeding.  Prenatal History/Complications: [facility] Pregnancy complications:  - THC use  Past Medical History: Past Medical History:  Diagnosis Date  . Medical history non-contributory     Past Surgical History: Past Surgical History:  Procedure Laterality Date  . NO PAST SURGERIES      Obstetrical History: OB History    Gravida  1   Para      Term      Preterm      AB      Living        SAB      TAB      Ectopic      Multiple      Live Births              Social History: Social History   Socioeconomic History  . Marital status: Single    Spouse name: Not on file  . Number of children: Not on file  . Years of education: Not on file  . Highest education level: Not on file  Occupational History  . Not on file  Social Needs  . Financial resource strain: Not hard at all  . Food insecurity:    Worry: Never true    Inability: Never true  . Transportation needs:    Medical: No    Non-medical: No  Tobacco Use  . Smoking status: Former Smoker    Types: Cigarettes    Last attempt to quit: 01/11/2018    Years since quitting: 0.6  . Smokeless tobacco: Never Used  Substance and Sexual Activity  . Alcohol use: Never    Frequency: Never  . Drug use: Not Currently    Types: Marijuana    Comment: last use Oct 2019  . Sexual activity: Yes    Birth control/protection: None  Lifestyle  . Physical activity:    Days per week: Not on file    Minutes per session: Not on file  . Stress: Not on file  Relationships  . Social connections:    Talks on phone: Not on file    Gets together: Not on file    Attends religious service: Not on file    Active member of club or organization: Not on file   Attends meetings of clubs or organizations: Not on file    Relationship status: Not on file  Other Topics Concern  . Not on file  Social History Narrative  . Not on file    Family History: Family History  Problem Relation Age of Onset  . Hypertension Maternal Grandmother   . Hypertension Paternal Grandmother   . Diabetes Paternal Grandmother     Allergies: No Known Allergies  Medications Prior to Admission  Medication Sig Dispense Refill Last Dose  . Doxylamine-Pyridoxine ER (BONJESTA) 20-20 MG TBCR Take 1 tablet by mouth 2 (two) times daily. Take at bedtime and also in AM if needed (Patient not taking: Reported on 04/19/2018) 60 tablet 4 Not Taking at Unknown time  . Elastic Bandages & Supports (COMFORT FIT MATERNITY SUPP MED) MISC Wear daily when ambulating (Patient not taking: Reported on 09/06/2018) 1 each 0 Not Taking at Unknown time  . ferrous sulfate (FERROUSUL) 325 (65 FE) MG tablet Take 1 tablet (325  mg total) by mouth 2 (two) times daily. (Patient not taking: Reported on 09/06/2018) 60 tablet 1 Not Taking at Unknown time  . hydrocortisone cream 1 % Apply 1 application topically 2 (two) times daily. (Patient not taking: Reported on 09/06/2018) 30 g 0 Not Taking at Unknown time  . hydrOXYzine (ATARAX/VISTARIL) 25 MG tablet Take 1 tablet (25 mg total) by mouth every 6 (six) hours as needed for itching. (Patient not taking: Reported on 09/06/2018) 30 tablet 2 Not Taking at Unknown time  . nystatin ointment (MYCOSTATIN) Apply 1 application topically 2 (two) times daily. (Patient not taking: Reported on 07/27/2018) 30 g 1 Not Taking at Unknown time  . zolpidem (AMBIEN) 5 MG tablet Take 1 tablet (5 mg total) by mouth at bedtime as needed for sleep. (Patient not taking: Reported on 09/06/2018) 30 tablet 1 Not Taking at Unknown time     Review of Systems  All systems reviewed and negative except as stated in HPI  Physical Exam Blood pressure 128/79, pulse (!) 101, temperature 98.1  F (36.7 C), temperature source Oral, resp. rate 18, height 5\' 3"  (1.6 m), weight 68.9 kg, last menstrual period 12/13/2017, SpO2 99 %. General appearance: alert, oriented, NAD Lungs: normal respiratory effort Heart: regular rate Abdomen: soft, non-tender; gravid, FH appropriate for GA Extremities: No calf swelling or tenderness Presentation: cephalic Fetal monitoring: baseline 120s/mod var/+ acels/no decels Uterine activity: irritability  Dilation: 3 Effacement (%): 50 Station: -1 Exam by:: Foye ClockS. Oklesh RN  Prenatal labs: ABO, Rh: --/--/O POS (12/25 1302) Antibody: NEG (12/25 1302) Rubella: 3.27 (06/21 1217) RPR: Non Reactive (10/15 1114)  HBsAg: Negative (06/21 1217)  HIV: Non Reactive (10/15 1114)  GC/Chlamydia: negative GBS: Negative (12/17 1618)  2-hr GTT: 80/157/98 Genetic screening:  Low risk Anatomy US: WNL  Prenatal Transfer Tool  Maternal Diabetes: No Genetic Screening: Normal Maternal Ultrasounds/Referrals: Normal Fetal Ultrasounds or other Referrals:  None Maternal Substance Abuse:  No Significant Maternal Medications:  None Significant Maternal Lab Results: None  Results for orders placed or performed during the hospital encounter of 09/06/18 (from the past 24 hour(s))  CBC   Collection Time: 09/06/18  1:02 PM  Result Value Ref Range   WBC 15.1 (H) 4.0 - 10.5 K/uL   RBC 4.11 3.87 - 5.11 MIL/uL   Hemoglobin 9.8 (L) 12.0 - 15.0 g/dL   HCT 16.131.8 (L) 09.636.0 - 04.546.0 %   MCV 77.4 (L) 80.0 - 100.0 fL   MCH 23.8 (L) 26.0 - 34.0 pg   MCHC 30.8 30.0 - 36.0 g/dL   RDW 40.914.3 81.111.5 - 91.415.5 %   Platelets 211 150 - 400 K/uL   nRBC 0.0 0.0 - 0.2 %  Type and screen [facility]   Collection Time: 09/06/18  1:02 PM  Result Value Ref Range   ABO/RH(D) O POS    Antibody Screen NEG    Sample Expiration      09/09/2018 Performed at [facility], [address] [place], [place] [id number]   Urine rapid drug screen (hosp performed)   Collection Time:  09/06/18  2:16 PM  Result Value Ref Range   Opiates NONE DETECTED NONE DETECTED   Cocaine NONE DETECTED NONE DETECTED   Benzodiazepines NONE DETECTED NONE DETECTED   Amphetamines NONE DETECTED NONE DETECTED   Tetrahydrocannabinol NONE DETECTED NONE DETECTED   Barbiturates NONE DETECTED NONE DETECTED    Patient Active Problem List   Diagnosis Date Noted  . Labor and delivery indication for care or intervention  09/06/2018  . Substance abuse affecting pregnancy in third trimester, antepartum 06/26/2018  . Anemia 06/23/2018  . 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 Supervision of normal first pregnancy, antepartum 03/03/2018  . Former smoker 03/03/2018  . Nausea and vomiting during pregnancy prior to [redacted] weeks gestation 03/03/2018    Assessment: Rue Valladares is a 22 y.o. G1P0 at [redacted]w[redacted]d here for IOL for PROM  #Labor: early labor. Will augment with pitocin if/when needed #Pain: Desires epidural #FWB: Cat 1 #ID:  GBS neg #MOF: breast #MOC: undecided. ?Nexplanon #Circ:  Outpatient  Gwenevere Abbot, MD Ob Fellow 09/06/2018, 9:22 PM

## 2018-09-06 NOTE — MAU Note (Signed)
PT presents to MAU with complaints of contractions that started yesterday and have gotten worse. Was evaluated in MAU last night. Denies any VB or LOF.

## 2018-09-06 NOTE — Anesthesia Pain Management Evaluation Note (Signed)
  [facility] Pain Management Visit Note  Patient: Hannah Bradford, 22 y.o., female  "Hello I am a member of the anesthesia team at [facility]. We have an anesthesia team available at all times to provide care throughout the hospital, including epidural management and anesthesia for C-section. I don't know your plan for the delivery whether it a natural birth, water birth, IV sedation, nitrous supplementation, doula or epidural, but we want to meet your pain goals."   1.Was your pain managed to your expectations on prior hospitalizations?   No prior hospitalizations  2.What is your expectation for pain management during this hospitalization?     Epidural and IV pain meds  3.How can we help you reach that goal? Possible epidural  Record the patient's initial score and the patient's pain goal.   Pain: 10  Pain Goal: 10 [facility] wants you to be able to say your pain was always managed very well.  Ieisha Gao 09/06/2018

## 2018-09-07 ENCOUNTER — Encounter: Payer: Medicaid Other | Admitting: Obstetrics and Gynecology

## 2018-09-07 ENCOUNTER — Encounter (HOSPITAL_COMMUNITY): Payer: Self-pay | Admitting: *Deleted

## 2018-09-07 DIAGNOSIS — Z3A38 38 weeks gestation of pregnancy: Secondary | ICD-10-CM

## 2018-09-07 DIAGNOSIS — O4202 Full-term premature rupture of membranes, onset of labor within 24 hours of rupture: Secondary | ICD-10-CM

## 2018-09-07 LAB — RPR: RPR: NONREACTIVE

## 2018-09-07 MED ORDER — SODIUM CHLORIDE 0.9 % IV SOLN
2.0000 g | Freq: Four times a day (QID) | INTRAVENOUS | Status: DC
  Administered 2018-09-07: 2 g via INTRAVENOUS
  Filled 2018-09-07: qty 2000
  Filled 2018-09-07: qty 2

## 2018-09-07 MED ORDER — TETANUS-DIPHTH-ACELL PERTUSSIS 5-2.5-18.5 LF-MCG/0.5 IM SUSP: 0.5000 mL | Freq: Once | INTRAMUSCULAR | Status: DC

## 2018-09-07 MED ORDER — SENNOSIDES-DOCUSATE SODIUM 8.6-50 MG PO TABS
2.0000 | ORAL_TABLET | ORAL | Status: DC
  Administered 2018-09-07 – 2018-09-09 (×2): 2 via ORAL
  Filled 2018-09-07 (×2): qty 2

## 2018-09-07 MED ORDER — IBUPROFEN 600 MG PO TABS
600.0000 mg | ORAL_TABLET | Freq: Four times a day (QID) | ORAL | Status: DC
  Administered 2018-09-07 – 2018-09-09 (×10): 600 mg via ORAL
  Filled 2018-09-07 (×10): qty 1

## 2018-09-07 MED ORDER — SIMETHICONE 80 MG PO CHEW: 80.0000 mg | CHEWABLE_TABLET | ORAL | Status: DC | PRN

## 2018-09-07 MED ORDER — WITCH HAZEL-GLYCERIN EX PADS: 1.0000 "application " | MEDICATED_PAD | CUTANEOUS | Status: DC | PRN

## 2018-09-07 MED ORDER — ACETAMINOPHEN 500 MG PO TABS
1000.0000 mg | ORAL_TABLET | Freq: Once | ORAL | Status: AC
  Administered 2018-09-07: 1000 mg via ORAL
  Filled 2018-09-07: qty 2

## 2018-09-07 MED ORDER — ONDANSETRON HCL 4 MG PO TABS: 4.0000 mg | ORAL_TABLET | ORAL | Status: DC | PRN

## 2018-09-07 MED ORDER — PRENATAL MULTIVITAMIN CH
1.0000 | ORAL_TABLET | Freq: Every day | ORAL | Status: DC
  Administered 2018-09-07 – 2018-09-09 (×3): 1 via ORAL
  Filled 2018-09-07 (×3): qty 1

## 2018-09-07 MED ORDER — COCONUT OIL OIL
1.0000 "application " | TOPICAL_OIL | Status: DC | PRN
  Administered 2018-09-07: 1 via TOPICAL
  Filled 2018-09-07: qty 120

## 2018-09-07 MED ORDER — GENTAMICIN SULFATE 40 MG/ML IJ SOLN
1.5000 mg/kg | Freq: Three times a day (TID) | INTRAVENOUS | Status: DC
  Administered 2018-09-07: 100 mg via INTRAVENOUS
  Filled 2018-09-07 (×2): qty 2.5

## 2018-09-07 MED ORDER — DIBUCAINE 1 % RE OINT: 1.0000 "application " | TOPICAL_OINTMENT | RECTAL | Status: DC | PRN

## 2018-09-07 MED ORDER — MEASLES, MUMPS & RUBELLA VAC IJ SOLR
0.5000 mL | Freq: Once | INTRAMUSCULAR | Status: DC
  Filled 2018-09-07: qty 0.5

## 2018-09-07 MED ORDER — ONDANSETRON HCL 4 MG/2ML IJ SOLN: 4.0000 mg | INTRAMUSCULAR | Status: DC | PRN

## 2018-09-07 MED ORDER — ACETAMINOPHEN 325 MG PO TABS: 650.0000 mg | ORAL_TABLET | ORAL | Status: DC | PRN

## 2018-09-07 MED ORDER — DIPHENHYDRAMINE HCL 25 MG PO CAPS: 25.0000 mg | ORAL_CAPSULE | Freq: Four times a day (QID) | ORAL | Status: DC | PRN

## 2018-09-07 MED ORDER — BENZOCAINE-MENTHOL 20-0.5 % EX AERO
1.0000 "application " | INHALATION_SPRAY | CUTANEOUS | Status: DC | PRN
  Administered 2018-09-07: 1 via TOPICAL
  Filled 2018-09-07: qty 56

## 2018-09-07 NOTE — Anesthesia Postprocedure Evaluation (Signed)
Anesthesia Post Note  Patient: Hannah SpragueMeilanie Bradford  Procedure(s) Performed: AN AD HOC LABOR EPIDURAL     Patient location during evaluation: Mother Baby Anesthesia Type: Epidural Level of consciousness: awake and alert Pain management: pain level controlled Vital Signs Assessment: post-procedure vital signs reviewed and stable Respiratory status: spontaneous breathing Cardiovascular status: blood pressure returned to baseline Postop Assessment: no headache, no backache, epidural receding, no apparent nausea or vomiting, patient able to bend at knees, able to ambulate and adequate PO intake Anesthetic complications: no    Last Vitals:  Vitals:   09/07/18 0927 09/07/18 1300  BP: 120/69 104/79  Pulse: (!) 119 99  Resp: 18 18  Temp: 37 C 36.8 C  SpO2: 100% 100%    Last Pain:  Vitals:   09/07/18 1300  TempSrc: Oral  PainSc: 0-No pain   Pain Goal:                 Janet Humphreys

## 2018-09-07 NOTE — Consult Note (Signed)
[facility]  Delivery Note:  SVD    09/07/2018  6:19 AM  I was called to the delivery room at the request of the patient's obstetrician (Dr. Phillip) for code APGAR due to fetal distress following delivery.  PRENATAL HX:  This is a 22 y/o G1P0 at 38 and 2/[redacted] weeks gestation who was admitted yesterday in labor.  Her pregnancy has been complicated by THC use.  She is GBS negative with SROM x18 hours.  However, she did develop chorioamnionitis during labor.    DELIVERY:  Per labor and delivery team, infant had poor respiratory effort after delivery and was given several breaths of PPV by labor and delivery staff.  APGARs 4 and 6.  NICU team arrived at 6 minutes of age and infant had good heart rate, but poor tone and response.  Pulse oximeter applied and O2 saturations in 90s.  Tone and response rapidly improved and 10 minute APGAR was 9.  After 10 minutes, baby left with nurse to assist parents with skin-to-skin care.  Infant is well appearing now, but given history of maternal chorioamnionitis, he is at increased risk for infectino.  Recommend q4h vital checks in central nursery.  Should there be any concern for infection in this infant, please contact NICU team for reassessment.    _____________________ Electronically Signed By: Brycelynn Stampley, MD Neonatologist  

## 2018-09-07 NOTE — Lactation Note (Addendum)
This note was copied from a baby's chart. Lactation Consultation Note:  P1, Infant is 6 hours old and has had 2 feedings.  Mother reports that infant has had good latch. She denies feeling any pain with latch.  Mother has been using cross cradle. Infant was placed in football hold.  Assist  Mother with hand expression and observed large drops of colostrum.  Mother excited to see so much colostrum.  Infant sustained latch for 10 mins good burst of rhythmic suckling.  Mother has good support with MGM at the bedside . Pts mother assisting to place infant in football on the alternate breast.   Lactation brochure given to mother with basic teaching done.  Discussed cue card and responding to infants cues for early feeding. Discussed cluster feeding. Advised mother to breastfeed infant 8-12 times in 24 hour.  Encouraged frequent STSD. [facility] as well as community breastfeeding support.    Patient Name: Boy Meriam SpragueMeilanie Colomb ZOXWR'UToday's Date: 09/07/2018 Reason for consult: Initial assessment   Maternal Data Has patient been taught Hand Expression?: Yes Does the patient have breastfeeding experience prior to this delivery?: No  Feeding Feeding Type: Breast Fed  LATCH Score Latch: Grasps breast easily, tongue down, lips flanged, rhythmical sucking.  Audible Swallowing: Spontaneous and intermittent  Type of Nipple: Everted at rest and after stimulation  Comfort (Breast/Nipple): Soft / non-tender  Hold (Positioning): Assistance needed to correctly position infant at breast and maintain latch.  LATCH Score: 9  Interventions Interventions: Breast feeding basics reviewed;Assisted with latch;Skin to skin;Hand express;Breast compression;Adjust position;Support pillows;Position options;Expressed milk  Lactation Tools Discussed/Used WIC Program: No(was active but missed appt.)   Consult Status Consult Status: Follow-up Date: 09/08/18 Follow-up type:  In-patient    Stevan BornKendrick, Junella Domke [facility] 09/07/2018, 2:36 PM

## 2018-09-08 NOTE — Lactation Note (Signed)
This note was copied from a baby's chart. Lactation Consultation Note  Patient Name: Hannah Bradford Reason for consult: Follow-up assessment Mom reports that feedings are going well.  Observed mom position baby in cross cradle hold.  Baby very sleepy and not showing interest in feeding.  Mom can easily express colostrum.  Instructed to watch for feeding cues and call for assist prn.  Maternal Data    Feeding Feeding Type: Breast Fed  LATCH Score Latch: Too sleepy or reluctant, no latch achieved, no sucking elicited.  Audible Swallowing: None  Type of Nipple: Everted at rest and after stimulation  Comfort (Breast/Nipple): Soft / non-tender  Hold (Positioning): No assistance needed to correctly position infant at breast.  LATCH Score: 6  Interventions    Lactation Tools Discussed/Used     Consult Status Consult Status: Follow-up Date: 09/09/18 Follow-up type: In-patient    Huston FoleyMOULDEN, Selby Slovacek S Bradford, 2:29 PM

## 2018-09-08 NOTE — Progress Notes (Signed)
CLINICAL SOCIAL WORK MATERNAL/CHILD NOTE  Patient Details  Name: Hannah Bradford MRN: [id number] Date of Birth: 09/07/2017  Date:  09/08/2018  Clinical Social Worker Initiating Note:  Kingsley Spittle LCSW  Date/Time: Initiated:  09/08/18/1400     Child's Name:  Hannah Bradford    Biological Parents:  Mother, Father   Need for Interpreter:  None   Reason for Referral:  Current Substance Use/Substance Use During Pregnancy    Address:  [place] [address]    Phone number:  [phone] (home)     Additional phone number: N/A  Household Members/Support Persons (HM/SP):   Household Member/Support Person 1   HM/SP Name Relationship DOB or Age  HM/SP -1 Merrilee Seashore FOB/ Boyfriend     HM/SP -2        HM/SP -3        HM/SP -4        HM/SP -5        HM/SP -6        HM/SP -7        HM/SP -8          Natural Supports (not living in the home):  Extended Family, Immediate Family   Professional Supports:     Employment: Unemployed   Type of Work:     Education:      Homebound arranged:    Pensions consultant:  Medicaid   Other Resources:  [employer], Physicist, medical    Cultural/Religious Considerations Which May Impact Care:  N/a  Strengths:  Ability to meet basic needs , Compliance with medical plan , Home prepared for child    Psychotropic Medications:         Pediatrician:       Pediatrician List:   [place]      Pediatrician Fax Number:    Risk Factors/Current Problems:  None   Cognitive State:  Goal Oriented , Alert    Mood/Affect:  Happy , Interested , Calm    CSW Assessment: CSW met with MOB via bedside due to MOB using THC during pregnancy. MOB was pleasant and appropriate during conversation. MOB was holding infant and was appropriate with infant. MOB is currently living with FOB/ boyfriend, Merrilee Seashore, and this is their first child. MOB voiced FOB  helping out as much as possible and them having a great relationship. MOB does not have any immediate family that live in [place] however FOB's family lives near by and is able to help as much as needed. MOB's family currently lives in [place] however are supportive and able to visit MOB when needed.   MOB is currently not working however plans to work at [employer] family business. FOB is currently working full time and is their primary financial support. MOB has Medicaid and does receive $68 a month for food stamps. MOB plans to go to the [facility] office this Monday in order to start infants [place]. MOB has car seat and appropriate sleeping arrangements ( has crib and pack n play).     CSW questioned if MOB used any drugs/ alcohol during pregnancy- MOB stated "I smoked weed but only about twice a month". MOB informed CSW that she used THC during pregnancy due to her nausea and back pain. CSW informed MOB of pending CDS and CPS report in the event CDS is positive for any substances. MOB voiced understanding  and had no questions/ concerns.   CSW provided education on baby blues vs PMAD's- MOB voiced no concerns at this time for anxiety/ depression. MOB was encouraged to monitor her emotions during postpartum and to contact her OBGYN in the event she has increased anxiety/ depression.   MOB does not have a pediatrician selected for infant at this time but is following up with multiple facilities.   No concerns at this time- CSW will continue to monitor CDS and complete CPS report in the event it is positive.     CSW Plan/Description:  No Further Intervention Required/No Barriers to Discharge, CSW Will Continue to Monitor Umbilical Cord Tissue Drug Screen Results and Make Report if Delila Spence, LCSW 09/08/2018, 3:20 PM

## 2018-09-08 NOTE — Lactation Note (Signed)
This note was copied from a baby's chart. Lactation Consultation Note  Patient Name: Hannah Bradford WUJWJ'XToday's Date: 09/08/2018 Reason for consult: Follow-up assessment;Primapara;1st time breastfeeding;Infant weight loss;Early term 4037-38.6wks  4938 hours old early term female who is being exclusively BF by his mother, she's a 48P1. RN called LC for assistance because mom was complaining of sore nipples. Mom was getting ready to feed baby when entering the room, offered assistance with latch but noticed that baby was very gassy and needed a diaper changed, dad changed his diaper, stool is already changing color to green and baby also had a void during diaper changing.   When revising hand expression with mom noticed that both of her nipples looked almost intact upon examination, only the the right one had a little tiny scab, almost invisible to the naked eye. Reviewed prevention and treatment for sore nipples, RN has been very proactive and already brought coconut oil to mom's room. Instructed mom how to use it prior pumping and for breast care.  LC took baby STS to mom's left breast and he was able to latch almost right away in football position with audible swallows heard, mom has plenty of colostrum, still some colostrum left over in bottle that she used to feed baby earlier 15 ml of EBM. Reviewed storage times for breastmilk, visitors arrived while mom was feeding baby and they're very pleased that baby was feeding without causing mom any pain, she was probably experiencing transient soreness.  Feeding plan:  1. Encouraged mom to keep putting baby to the breast STS 8-12 times/24 hours or sooner if feeding cues are present 2. Mom will also pump PRN and feed baby any amount of EBM she may get.  3. She'll use her colostrum to treat and prevent sore nipples and coconut oil prior pumping  Parents reported all questions and concerns were answered, they're both aware of LC services and will call  PRN  Maternal Data    Feeding Feeding Type: Breast Fed  LATCH Score Latch: Grasps breast easily, tongue down, lips flanged, rhythmical sucking.  Audible Swallowing: A few with stimulation  Type of Nipple: Everted at rest and after stimulation  Comfort (Breast/Nipple): Filling, red/small blisters or bruises, mild/mod discomfort(very mild discomfort, probably due to transient soreness)  Hold (Positioning): No assistance needed to correctly position infant at breast.(minimal assistance needed)  LATCH Score: 8  Interventions Interventions: Breast feeding basics reviewed;Assisted with latch;Skin to skin;Breast massage;Hand express;Breast compression;Adjust position;Support pillows  Lactation Tools Discussed/Used     Consult Status Consult Status: Follow-up Date: 09/09/18 Follow-up type: In-patient    Hannah Bradford 09/08/2018, 8:20 PM

## 2018-09-08 NOTE — Anesthesia Postprocedure Evaluation (Signed)
Anesthesia Post Note  Patient: Hannah Bradford  Procedure(s) Performed: AN AD HOC LABOR EPIDURAL     Patient location during evaluation: Mother Baby Anesthesia Type: Epidural Level of consciousness: awake and alert, oriented and patient cooperative Pain management: pain level controlled Vital Signs Assessment: post-procedure vital signs reviewed and stable Respiratory status: spontaneous breathing Cardiovascular status: stable Postop Assessment: no headache, epidural receding, patient able to bend at knees and no signs of nausea or vomiting Anesthetic complications: no Comments: Pain score 2.    Last Vitals:  Vitals:   09/07/18 2100 09/08/18 0530  BP: 117/72 104/65  Pulse: 88 86  Resp: 18 18  Temp: 36.7 C 36.6 C  SpO2: 100% 100%    Last Pain:  Vitals:   09/08/18 0530  TempSrc: Oral  PainSc: 4    Pain Goal:                 [facility] LLCWRINKLE,[name]

## 2018-09-08 NOTE — Progress Notes (Addendum)
POSTPARTUM PROGRESS NOTE   Subjective:  Hannah Bradford is a 22 y.o. G1P1001 69w2dPPD #1 s/p NSVD.  No acute events overnight.  Pt denies problems with ambulating, voiding or po intake.  She denies nausea or vomiting.  Pain is moderately controlled.  She has had flatus. She has not had bowel movement.  Lochia Small. Having some difficulty with latch while breastfeeding, and is concerned that she is not making enough milk yet. Counseled on normal postpartum milk production, and discussed asking for help with latch from lactation consultants and nursing when needed.   Objective: Blood pressure 104/65, pulse 86, temperature 97.9 F (36.6 C), temperature source Oral, resp. rate 18, height 5' 3"  (1.6 m), weight 68.9 kg, last menstrual period 12/13/2017, SpO2 100 %, unknown if currently breastfeeding.  Physical Exam:  General: alert, cooperative and no distress Lochia:normal flow Chest: no respiratory distress Heart:regular rate, distal pulses intact Incision: NA Abdomen: soft, nontender,  Uterine Fundus: firm, appropriately tender DVT Evaluation: No calf swelling or tenderness Extremities: no pedal edema  Recent Labs    09/06/18 1302  HGB 9.8*  HCT 31.8*    Assessment/Plan:  ASSESSMENT: MMariely Bradford a 22y.o. G1P1001 386w2d/p SVD.  -breastfeeding, having some difficulty/concerns with latch -needs varicella vaccination postpartum -considering Nexplanon for postpartum contraception, but still not 100% sure  Plan for discharge tomorrow   LOS: 2 days   Bridgid H WilsonMD 09/08/2018, 9:45 AM   I personally saw and evaluated the patient, performing the key elements of the service. I developed and verified the management plan that is described in the resident's/student's note, and I agree with the content with my edits above. VSS, HRR&R, Resp unlabored, Legs neg.  FrNigel BertholdCNM 09/12/2018 9:25 AM

## 2018-09-09 MED ORDER — SIMETHICONE 80 MG PO CHEW: 80.0000 mg | CHEWABLE_TABLET | ORAL | 0 refills | Status: DC | PRN

## 2018-09-09 MED ORDER — IBUPROFEN 600 MG PO TABS: 600.0000 mg | ORAL_TABLET | Freq: Four times a day (QID) | ORAL | 0 refills | Status: DC | PRN

## 2018-09-09 MED ORDER — ACETAMINOPHEN 325 MG PO TABS: 650.0000 mg | ORAL_TABLET | ORAL | 0 refills | Status: DC | PRN

## 2018-09-09 MED ORDER — SENNOSIDES-DOCUSATE SODIUM 8.6-50 MG PO TABS: 2.0000 | ORAL_TABLET | ORAL | 0 refills | Status: DC

## 2018-09-09 MED ORDER — PRENATAL MULTIVITAMIN CH: 1.0000 | ORAL_TABLET | Freq: Every day | ORAL | 0 refills | Status: DC

## 2018-09-09 NOTE — Discharge Summary (Addendum)
Postpartum Discharge Summary     Patient Name: Hannah Bradford DOB: 08/12/1996 MRN: [id number]  Date of admission: 09/06/2018 Delivering Provider: Gwenevere Bradford, Hannah Bradford   Date of discharge: 09/09/2018  Admitting diagnosis: 38wks, CTX 2-183min Intrauterine pregnancy: 353w2d     Secondary diagnosis:  Active Problems:   Labor and delivery indication for care or intervention  Additional problems: induction of labor for premature rupture of membranes, intrapartum infection     Discharge diagnosis: Term Pregnancy Delivered                                                                                                Post partum procedures:none  Augmentation: none  Complications: None  Hospital course:  Onset of Labor With Vaginal Delivery     22 y.o. yo G1P1001 at 4453w2d was admitted in Latent Labor on 09/06/2018. Patient had an uncomplicated labor course as follows:  Membrane Rupture Time/Date: 12:45 PM ,09/06/2018   Intrapartum Procedures: Episiotomy: None [1]                                         Lacerations:  None [1]  Patient had a delivery of a Viable infant. 09/07/2018  Information for the patient's newborn:  Hannah Bradford, Boy Hannah Bradford [[id number]][[id number]]  Delivery Method: Vaginal, Spontaneous(Filed from Delivery Summary)    [name] had an uncomplicated postpartum course.  She is ambulating, tolerating a regular diet, passing flatus, and urinating well. Patient is discharged home in stable condition on 09/09/18.   Magnesium Sulfate recieved: No BMZ received: No  Physical exam  Vitals:   09/08/18 0530 09/08/18 1452 09/08/18 2231 09/09/18 0531  BP: 104/65 123/76 124/68 126/70  Pulse: 86 86 87 85  Resp: 18  18 18   Temp: 97.9 F (36.6 C) 98 F (36.7 C)  98.2 F (36.8 C)  TempSrc: Oral Oral  Oral  SpO2: 100% 100%    Weight:      Height:       General: alert, sitting in bed, NAD Lochia: appropriate Uterine Fundus: firm Incision: N/A DVT Evaluation: No evidence of DVT seen on  physical exam. Labs: Lab Results  Component Value Date   WBC 15.1 (H) 09/06/2018   HGB 9.8 (L) 09/06/2018   HCT 31.8 (L) 09/06/2018   MCV 77.4 (L) 09/06/2018   PLT 211 09/06/2018   CMP Latest Ref Rng & Units 08/05/2018  Glucose 70 - 99 mg/dL 97  BUN 6 - 20 mg/dL 9  Creatinine 0.860.44 - 5.781.00 mg/dL 4.690.50  Sodium 629135 - 528145 mmol/L 134(L)  Potassium 3.5 - 5.1 mmol/L 3.9  Chloride 98 - 111 mmol/L 103  CO2 22 - 32 mmol/L 21(L)  Calcium 8.9 - 10.3 mg/dL 4.1(L8.6(L)  Total Protein 6.5 - 8.1 Bradford/dL 6.8  Total Bilirubin 0.3 - 1.2 mg/dL 0.3  Alkaline Phos 38 - 126 U/L 114  AST 15 - 41 U/L 16  ALT 0 - 44 U/L 11    Discharge instruction: per After Visit Summary and "Baby and Me Booklet".  After visit meds:  Allergies as of 09/09/2018   No Known Allergies     Medication List    STOP taking these medications   COMFORT FIT MATERNITY SUPP MED Misc   Doxylamine-Pyridoxine ER 20-20 MG Tbcr Commonly known as:  BONJESTA   hydrocortisone cream 1 %   hydrOXYzine 25 MG tablet Commonly known as:  ATARAX/VISTARIL   nystatin ointment Commonly known as:  MYCOSTATIN   zolpidem 5 MG tablet Commonly known as:  AMBIEN     TAKE these medications   acetaminophen 325 MG tablet Commonly known as:  TYLENOL Take 2 tablets (650 mg total) by mouth every 4 (four) hours as needed for mild pain or moderate pain.   ferrous sulfate 325 (65 FE) MG tablet Commonly known as:  FERROUSUL Take 1 tablet (325 mg total) by mouth 2 (two) times daily.   ibuprofen 600 MG tablet Commonly known as:  ADVIL,MOTRIN Take 1 tablet (600 mg total) by mouth every 6 (six) hours as needed for moderate pain or cramping.   prenatal multivitamin Tabs tablet Take 1 tablet by mouth daily at 12 noon.   senna-docusate 8.6-50 MG tablet Commonly known as:  Senokot-S Take 2 tablets by mouth daily. Start taking on:  September 10, 2018   simethicone 80 MG chewable tablet Commonly known as:  MYLICON Chew 1 tablet (80 mg total) by  mouth as needed for flatulence.       Diet: routine diet  Activity: Advance as tolerated. Pelvic rest for 6 weeks.   Outpatient follow up:4 weeks Follow up Appt: Future Appointments  Date Time Provider Department Center  10/05/2018 10:00 AM Hannah Bradford, Hannah G, MD [facility] None   Follow up Visit:   Please schedule this patient for Postpartum visit in: 4 weeks with the following provider: Any provider For C/S patients schedule nurse incision check in weeks 2 weeks: no Low risk pregnancy complicated by: none Delivery mode:  SVD Anticipated Birth Control:  considering Nexplanon, still undecided PP Procedures needed: none  Schedule Integrated BH visit: no      Newborn Data: Live born female  Birth Weight: 6 lb 4 oz (2835 Bradford) APGAR: 4, 6  Newborn Delivery   Birth date/time:  09/07/2018 06:03:00 Delivery type:  Vaginal, Spontaneous     Baby Feeding: Breast Disposition:home with mother   09/09/2018 Hannah NewcomerBridgid H Wilson, MD  OB FELLOW DISCHARGE ATTESTATION  I have seen and examined this patient and agree with above documentation in the resident's note.   Hannah AbbotNimeka Jasean Ambrosia, MD  OB Fellow  09/12/2018, 8:38 AM

## 2018-09-09 NOTE — Lactation Note (Signed)
This note was copied from a baby's chart. Lactation Consultation Note  Patient Name: Boy Meriam SpragueMeilanie Colden WUJWJ'XToday's Date: 09/09/2018 Reason for consult: Follow-up assessment Baby is 851 hours old and at a 6% weight loss.  Mom reports feedings are going well.  Breasts filling.  Discussed milk coming to volume and the prevention and treatment of engorgement.  Questions answered.  Reviewed lactation services and support and encouraged prn.  Maternal Data    Feeding Feeding Type: Breast Fed  LATCH Score                   Interventions    Lactation Tools Discussed/Used     Consult Status Consult Status: Complete Follow-up type: Call as needed    Huston FoleyMOULDEN, Firmin Belisle S 09/09/2018, 9:58 AM

## 2018-09-15 ENCOUNTER — Encounter: Payer: Medicaid Other | Admitting: Obstetrics & Gynecology

## 2018-10-03 ENCOUNTER — Encounter (HOSPITAL_COMMUNITY): Payer: Self-pay

## 2018-10-03 ENCOUNTER — Ambulatory Visit (HOSPITAL_COMMUNITY)
Admission: EM | Admit: 2018-10-03 | Discharge: 2018-10-03 | Disposition: A | Discharge status: Home / Self Care | Payer: Medicaid Other | Attending: Family Medicine | Admitting: Family Medicine

## 2018-10-03 DIAGNOSIS — J039 Acute tonsillitis, unspecified: Secondary | ICD-10-CM | POA: Diagnosis not present

## 2018-10-03 LAB — POCT RAPID STREP A: Streptococcus, Group A Screen (Direct): NEGATIVE

## 2018-10-03 MED ORDER — PENICILLIN V POTASSIUM 500 MG PO TABS: 500.0000 mg | ORAL_TABLET | Freq: Two times a day (BID) | ORAL | 0 refills | Status: AC

## 2018-10-03 NOTE — ED Provider Notes (Signed)
[facility]    CSN: [id number] Arrival date & time: 10/03/18  1433     History   Chief Complaint Chief Complaint  Patient presents with  . Sore Throat    HPI Hannah Bradford is a 23 y.o. female.   HPI  Patient's had a sore throat getting worse every day for the last 4 days.  Today she can hardly swallow.  She has a fever.  Swollen glands in her neck.  Headache.  No nausea or vomiting.  No abdominal pain.  No runny or stuffy nose.  No cough.  She states she has some mucus in the back of her throat because she does not feel like she can swallow well.  No known exposure to any illness. She has a 42-week-old baby.  Baby is well.  She has not been out, or had any contact with people who have been sick since before baby was born.   Past Medical History:  Diagnosis Date  . Medical history non-contributory     Patient Active Problem List   Diagnosis Date Noted  . Labor and delivery indication for care or intervention 09/06/2018  . Substance abuse affecting pregnancy in third trimester, antepartum 06/26/2018  . Anemia 06/23/2018  . 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 Supervision of normal first pregnancy, antepartum 03/03/2018  . Former smoker 03/03/2018  . Nausea and vomiting during pregnancy prior to [redacted] weeks gestation 03/03/2018    Past Surgical History:  Procedure Laterality Date  . NO PAST SURGERIES      OB History    Gravida  1   Para  1   Term  1   Preterm      AB      Living  1     SAB      TAB      Ectopic      Multiple  0   Live Births  1            Home Medications    Prior to Admission medications   Medication Sig Start Date End Date Taking? Authorizing Provider  acetaminophen (TYLENOL) 325 MG tablet Take 2 tablets (650 mg total) by mouth every 4 (four) hours as needed for mild pain or moderate pain. 09/09/18   Henderson Newcomer, MD  ibuprofen (ADVIL,MOTRIN) 600 MG tablet Take 1 tablet (600 mg total) by mouth every 6 (six) hours  as needed for moderate pain or cramping. 09/09/18   Henderson Newcomer, MD  penicillin v potassium (VEETID) 500 MG tablet Take 1 tablet (500 mg total) by mouth 2 (two) times daily for 10 days. 10/03/18 10/13/18  Eustace Moore, MD  Prenatal Vit-Fe Fumarate-FA (PRENATAL MULTIVITAMIN) TABS tablet Take 1 tablet by mouth daily at 12 noon. 09/09/18   Henderson Newcomer, MD  senna-docusate (SENOKOT-S) 8.6-50 MG tablet Take 2 tablets by mouth daily. 09/10/18   Henderson Newcomer, MD  simethicone (MYLICON) 80 MG chewable tablet Chew 1 tablet (80 mg total) by mouth as needed for flatulence. 09/09/18   Henderson Newcomer, MD    Family History Family History  Problem Relation Age of Onset  . Hypertension Maternal Grandmother   . Hypertension Paternal Grandmother   . Diabetes Paternal Grandmother     Social History Social History   Tobacco Use  . Smoking status: Former Smoker    Types: Cigarettes    Last attempt to quit: 01/11/2018    Years since quitting: 0.7  . Smokeless tobacco: Never Used  Substance Use Topics  . Alcohol use: Never    Frequency: Never  . Drug use: Not Currently    Types: Marijuana    Comment: last use Oct 2019     Allergies   Patient has no known allergies.   Review of Systems Review of Systems  Constitutional: Positive for fever. Negative for chills.  HENT: Positive for sore throat. Negative for ear pain.   Eyes: Negative for pain and visual disturbance.  Respiratory: Negative for cough and shortness of breath.   Cardiovascular: Negative for chest pain and palpitations.  Gastrointestinal: Negative for abdominal pain and vomiting.  Genitourinary: Negative for dysuria and hematuria.  Musculoskeletal: Negative for arthralgias and back pain.  Skin: Negative for color change and rash.  Neurological: Negative for seizures and syncope.  All other systems reviewed and are negative.    Physical Exam Triage Vital Signs ED Triage Vitals  Enc Vitals Group     BP  10/03/18 1446 (!) 143/89     Pulse Rate 10/03/18 1446 100     Resp 10/03/18 1446 16     Temp 10/03/18 1446 (!) 101.7 F (38.7 C)     Temp Source 10/03/18 1446 Oral     SpO2 10/03/18 1446 100 %     Weight --      Height --      Head Circumference --      Peak Flow --      Pain Score 10/03/18 1447 10     Pain Loc --      Pain Edu? --      Excl. in GC? --    No data found.  Updated Vital Signs BP (!) 143/89 (BP Location: Left Arm)   Pulse 100   Temp (!) 101.7 F (38.7 C) (Oral)   Resp 16   SpO2 100%   Visual Acuity Right Eye Distance:   Left Eye Distance:   Bilateral Distance:    Right Eye Near:   Left Eye Near:    Bilateral Near:     Physical Exam Constitutional:      General: She is not in acute distress.    Appearance: She is well-developed.  HENT:     Head: Normocephalic and atraumatic.     Right Ear: Tympanic membrane and ear canal normal.     Left Ear: Tympanic membrane and ear canal normal.     Nose: No congestion.     Mouth/Throat:     Mouth: Mucous membranes are moist.     Pharynx: Pharyngeal swelling and posterior oropharyngeal erythema present.     Tonsils: Tonsillar exudate present. Swelling: 3+ on the right. 3+ on the left.  Eyes:     Conjunctiva/sclera: Conjunctivae normal.     Pupils: Pupils are equal, round, and reactive to light.  Neck:     Musculoskeletal: Normal range of motion.  Cardiovascular:     Rate and Rhythm: Normal rate and regular rhythm.     Heart sounds: Normal heart sounds.  Pulmonary:     Effort: Pulmonary effort is normal. No respiratory distress.  Abdominal:     General: There is no distension.     Palpations: Abdomen is soft.  Musculoskeletal: Normal range of motion.  Lymphadenopathy:     Cervical: Cervical adenopathy present.  Skin:    General: Skin is warm and dry.  Neurological:     Mental Status: She is alert.      UC Treatments / Results  Labs (all labs ordered are  listed, but only abnormal results are  displayed) Labs Reviewed  CULTURE, GROUP A STREP [facility])  POCT RAPID STREP A    EKG None  Radiology No results found.  Procedures Procedures (including critical care time)  Medications Ordered in UC Medications - No data to display  Initial Impression / Assessment and Plan / UC Course  I have reviewed the triage vital signs and the nursing notes.  Pertinent labs & imaging results that were available during my care of the patient were reviewed by me and considered in my medical decision making (see chart for details).     Strep is negative.  Tonsils large and infected.  Will cover with antibiotics pending culture result.  Discussed with patient and husband prevention for newborn, handwashing.   Final Clinical Impressions(s) / UC Diagnoses   Final diagnoses:  Tonsillitis     Discharge Instructions     The rapid strep test is negative.  I am sending a throat culture.  This will take 2 or 3 days to get back.  The throat culture is more accurate than the rapid strep test. Because her tonsils are so inflamed, I am going to start you on a penicillin antibiotic.  If the strep culture comes back negative, it is important that you take this for 10 full days.  If your strep culture is negative, you may stop the antibiotics as soon as you feel well enough. Take Tylenol or ibuprofen as needed for pain. Drink plenty of fluids. You may use salt water gargles or a Chloraseptic spray for the throat pain Make sure to wash your hands frequently, wash your hands before you touch or handle your baby    ED Prescriptions    Medication Sig Dispense Auth. Provider   penicillin v potassium (VEETID) 500 MG tablet Take 1 tablet (500 mg total) by mouth 2 (two) times daily for 10 days. 20 tablet Eustace Moore, MD     Controlled Substance Prescriptions [place] Controlled Substance Registry consulted? Not Applicable   Eustace Moore, MD 10/03/18 [phone]

## 2018-10-03 NOTE — Discharge Instructions (Signed)
The rapid strep test is negative.  I am sending a throat culture.  This will take 2 or 3 days to get back.  The throat culture is more accurate than the rapid strep test. Because her tonsils are so inflamed, I am going to start you on a penicillin antibiotic.  If the strep culture comes back negative, it is important that you take this for 10 full days.  If your strep culture is negative, you may stop the antibiotics as soon as you feel well enough. Take Tylenol or ibuprofen as needed for pain. Drink plenty of fluids. You may use salt water gargles or a Chloraseptic spray for the throat pain Make sure to wash your hands frequently, wash your hands before you touch or handle your baby

## 2018-10-03 NOTE — ED Triage Notes (Signed)
Pt c/o sore throat x4days.

## 2018-10-05 ENCOUNTER — Ambulatory Visit: Payer: Medicaid Other | Admitting: Obstetrics & Gynecology

## 2018-10-05 LAB — CULTURE, GROUP A STREP (THRC)

## 2018-10-18 ENCOUNTER — Encounter: Payer: Self-pay | Admitting: Obstetrics and Gynecology

## 2018-10-18 ENCOUNTER — Ambulatory Visit (INDEPENDENT_AMBULATORY_CARE_PROVIDER_SITE_OTHER): Payer: Medicaid Other | Admitting: Obstetrics and Gynecology

## 2018-10-18 DIAGNOSIS — Z1389 Encounter for screening for other disorder: Secondary | ICD-10-CM | POA: Diagnosis not present

## 2018-10-18 NOTE — Patient Instructions (Signed)

## 2018-10-18 NOTE — Progress Notes (Signed)
Patient is currently sexually active, states that she does not want BC at this time.  Marland KitchenArlie Solomons Partum Exam  Hannah Bradford is a 23 y.o. G24P1001 female who presents for a postpartum visit. She is 6 weeks postpartum following a spontaneous vaginal delivery. I have fully reviewed the prenatal and intrapartum course. The delivery was at 38.2 gestational weeks.  Anesthesia: epidural. Postpartum course has been good. Baby's course has been good. Baby is feeding by both breast and bottle - Infamil. Bleeding staining only. Bowel function is normal. Bladder function is normal. Patient is sexually active. Contraception method is none. Postpartum depression screening:neg  The following portions of the patient's history were reviewed and updated as appropriate: allergies, current medications, past family history, past medical history, past social history, past surgical history and problem list.  Last pap smear done 02/2018 and was Normal  Review of Systems Pertinent items noted in HPI and remainder of comprehensive ROS otherwise negative.    Objective:  unknown if currently breastfeeding.  General:  alert   Breasts:  not examined  Lungs: clear to auscultation bilaterally  Heart:  regular rate and rhythm, S1, S2 normal, no murmur, click, rub or gallop  Abdomen: soft, non-tender; bowel sounds normal; no masses,  no organomegaly   Vulva:  not evaluated  Vagina: not evaluated  Cervix:  not examined  Corpus: not examined  Adnexa:  not evaluated  Rectal Exam: Not performed.        Assessment:    Nl postpartum exam.   Plan:   1. Contraception: condoms. Information on choices provided to pt 2. Return to nl ADL's 3. Follow up in: 1 yr or as needed.

## 2019-06-20 IMAGING — US US MFM OB COMP +14 WKS
1 series · 14 of 28 positions shown · non-contrast
Comparison: none

[Series 1: us mfm ob comp +14 wks · 92 acquisitions, 14 frames shown]
[im 4/92]
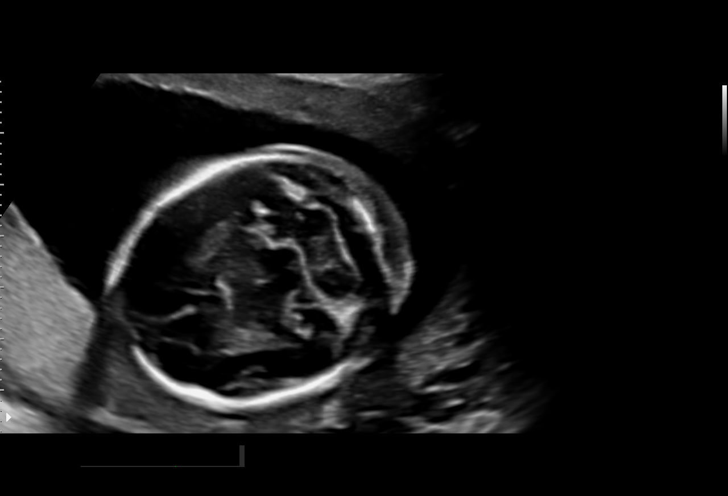
[im 11/92]
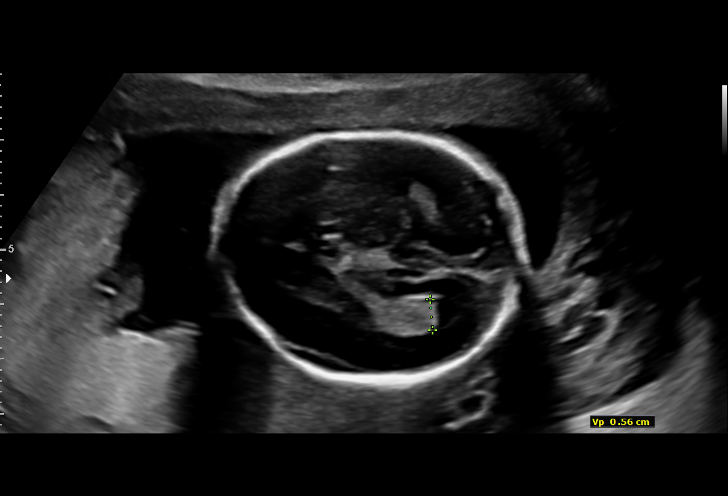
[im 17/92]
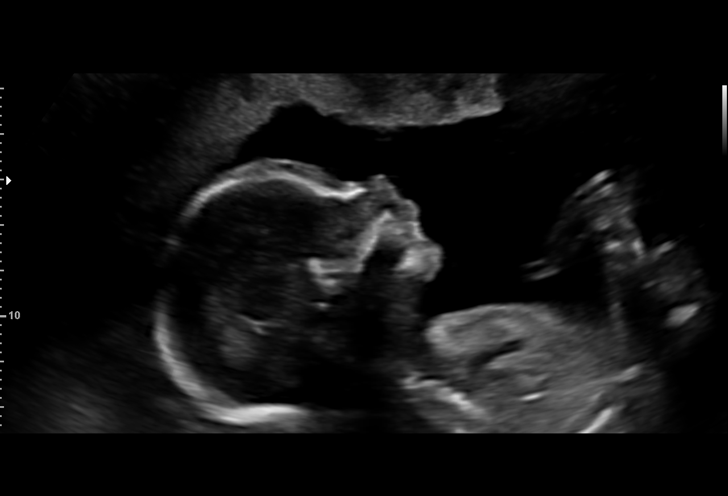
[im 24/92]
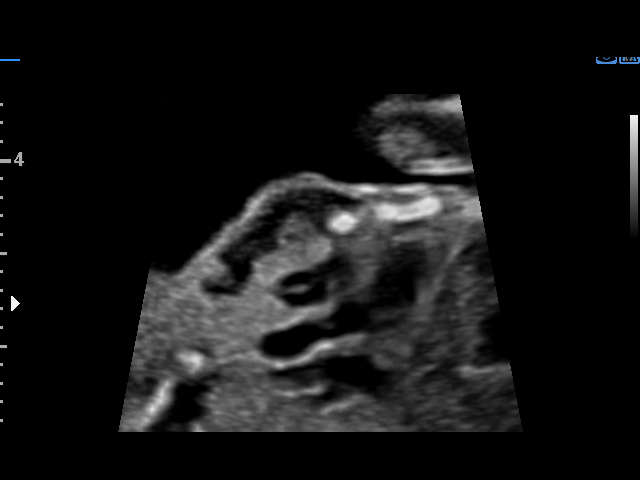
[im 31/92]
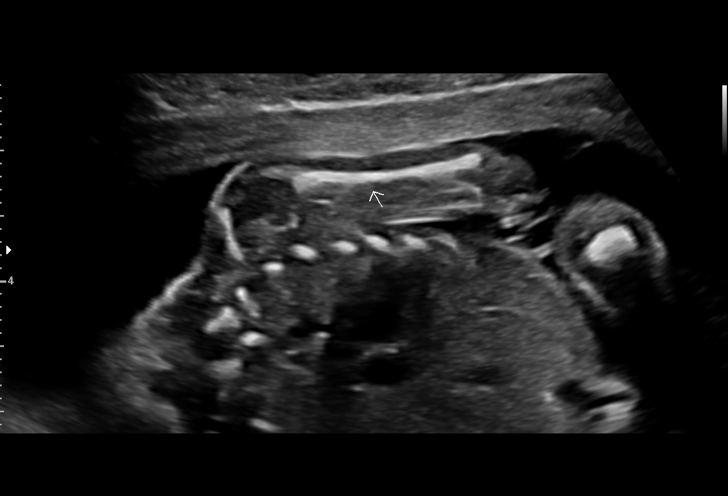
[im 38/92]
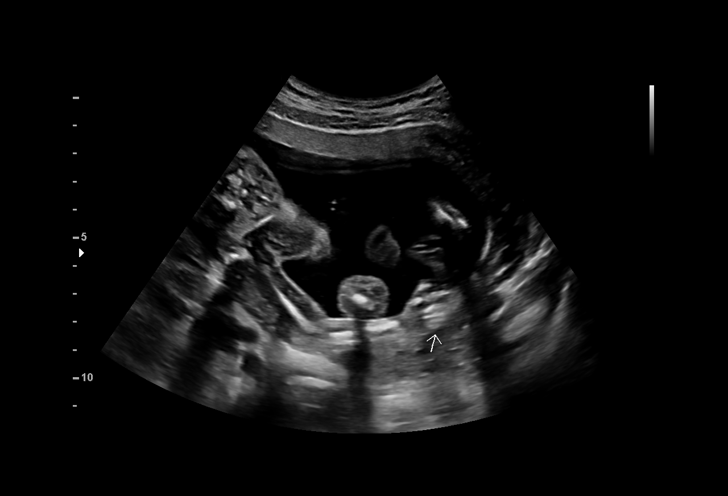
[im 44/92]
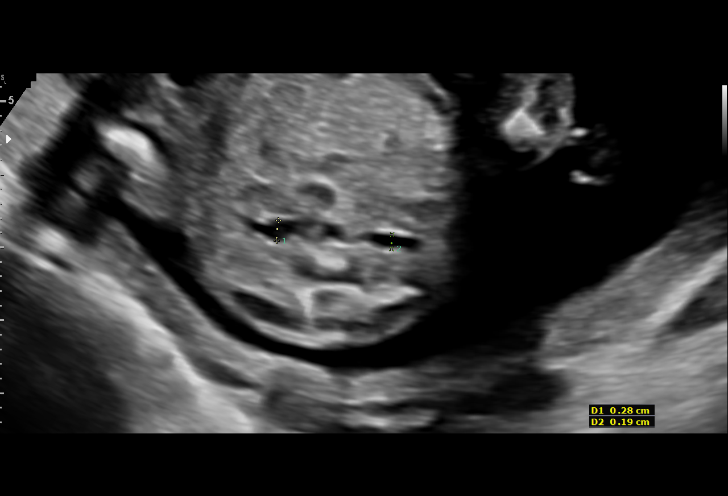
[im 51/92]
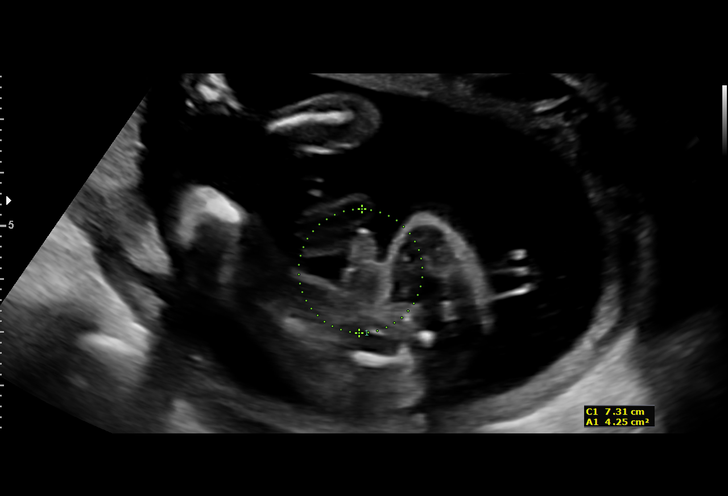
[im 58/92]
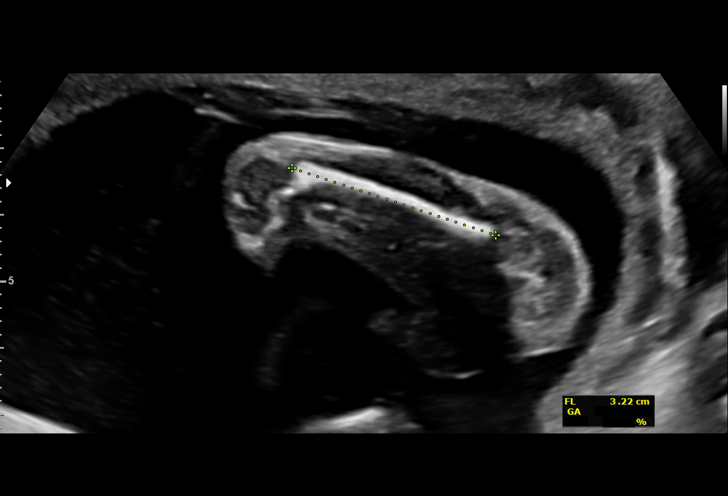
[im 65/92]
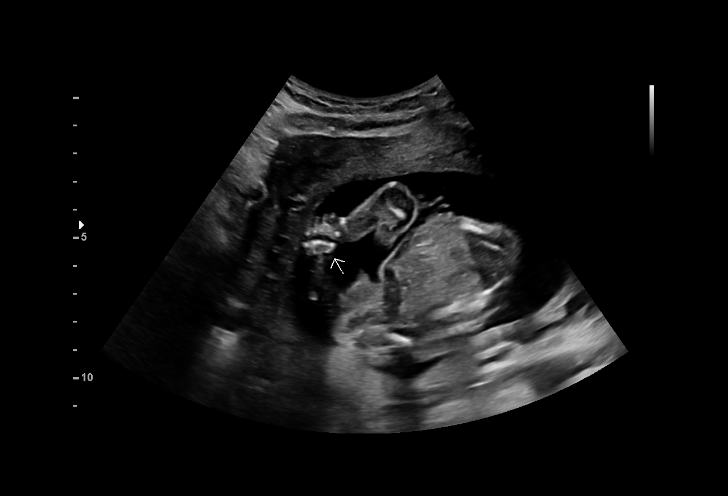
[im 71/92]
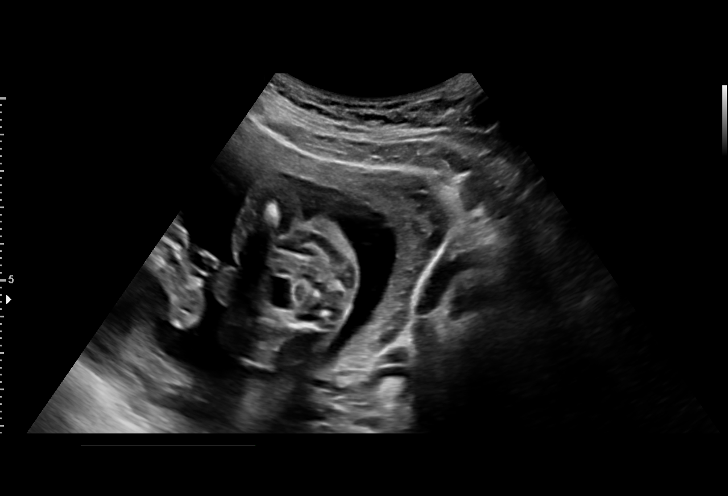
[im 78/92]
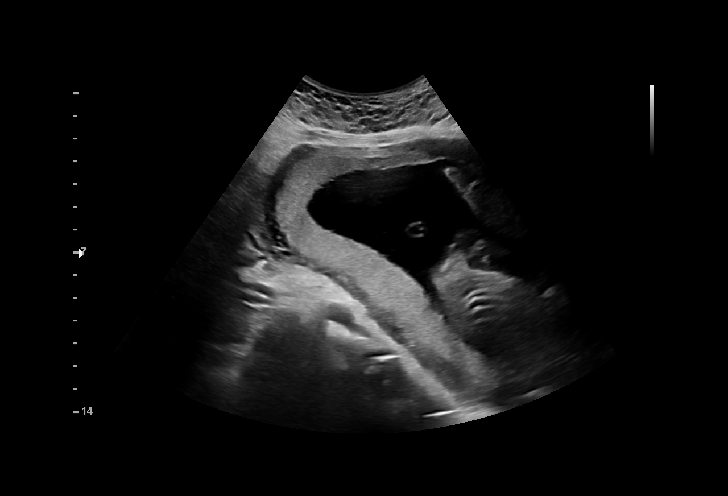
[im 85/92]
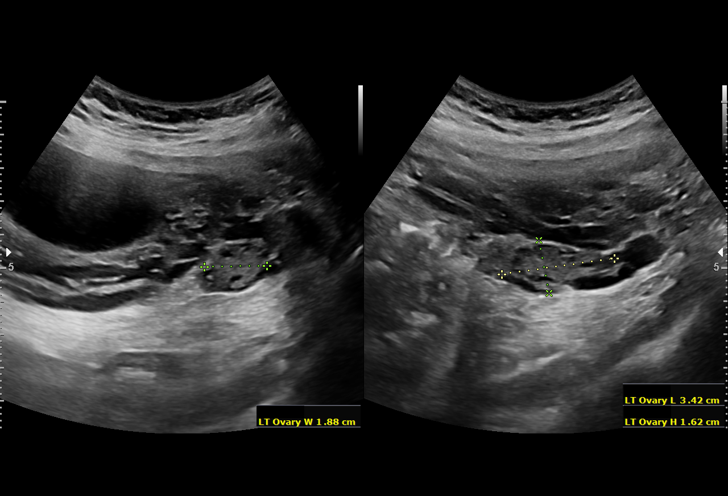
[im 92/92]
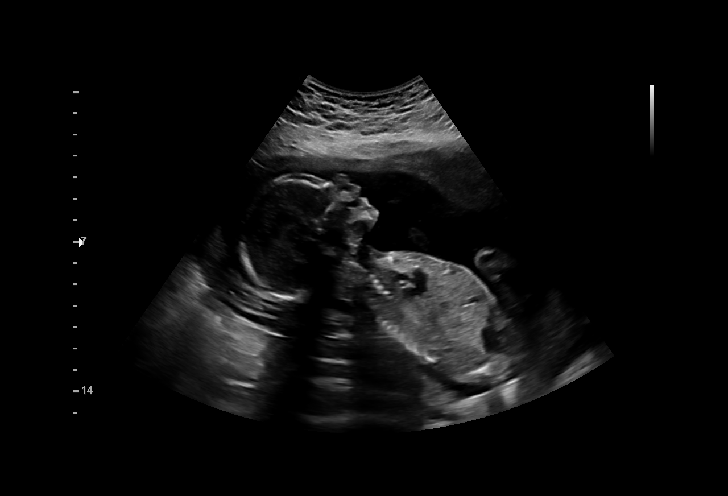

[14 of 28 positions shown; findings below may reference images not displayed]

NERO NP

1  ASHIPALA CHAKUFORA           [id number]      [id number]     [id number]
Indications

Encounter for antenatal screening for
malformations
19 weeks gestation of pregnancy
OB History

Blood Type:            Height:  5'3"   Weight (lb):  135       BMI:
Gravidity:    1
Fetal Evaluation

Num Of Fetuses:     1
Fetal Heart         135
Rate(bpm):
Cardiac Activity:   Observed
Presentation:       Breech
Placenta:           Right lateral
P. Cord Insertion:  Visualized

Amniotic Fluid
AFI FV:      Within normal limits

Largest Pocket(cm)
4.77
Biometry

BPD:      44.9  mm     G. Age:  19w 4d         69  %    CI:        72.07   %    70 - 86
FL/HC:      18.7   %    16.1 -
HC:      168.3  mm     G. Age:  19w 4d         59  %    HC/AC:      1.14        1.09 -
AC:      148.2  mm     G. Age:  20w 1d         76  %    FL/BPD:     70.2   %
FL:       31.5  mm     G. Age:  19w 6d         66  %    FL/AC:      21.3   %    20 - 24
HUM:      29.2  mm     G. Age:  19w 4d         60  %
CER:      20.3  mm     G. Age:  19w 2d         53  %
NFT:         5  mm
CM:        5.2  mm

Est. FW:     319  gm    0 lb 11 oz      57  %
Gestational Age

LMP:           19w 1d        Date:  12/13/17                 EDD:   09/19/18
U/S Today:     19w 6d                                        EDD:   09/14/18
Best:          19w 1d     Det. By:  LMP  (12/13/17)          EDD:   09/19/18
Anatomy

Cranium:               Appears normal         Aortic Arch:            Appears normal
Cavum:                 Appears normal         Ductal Arch:            Appears normal
Ventricles:            Appears normal         Diaphragm:              Appears normal
Choroid Plexus:        Appears normal         Stomach:                Appears normal, left
sided
Cerebellum:            Appears normal         Abdomen:                Appears normal
Posterior Fossa:       Appears normal         Abdominal Wall:         Appears nml (cord
insert, abd wall)
Nuchal Fold:           Appears normal         Cord Vessels:           Appears normal (3
vessel cord)
Face:                  Appears normal         Kidneys:                Appear normal
(orbits and profile)
Lips:                  Appears normal         Bladder:                Appears normal
Thoracic:              Appears normal         Spine:                  Ltd views no
intracranial signs of
NT
Heart:                 Appears normal         Upper Extremities:      Appears normal
(4CH, axis, and situs
RVOT:                  Appears normal         Lower Extremities:      Appears normal
LVOT:                  Appears normal

Other:  Fetus appears to be a male. Heels visualized. Nasal bone visualized.
Cervix Uterus Adnexa

Cervix
Length:            4.8  cm.
Normal appearance by transabdominal scan.

Uterus
No abnormality visualized.

Left Ovary
Size(cm)       3.4  x   1.6    x  1.9       Vol(ml):
Within normal limits.

Right Ovary
Not visualized.
Impression

We performed fetal anatomy scan. No makers of
aneuploidies or fetal structural defects are seen. Fetal
biometry is consistent with her previously-established dates.
Amniotic fluid is normal and good fetal activity is seen.

On cell-free fetal DNA screening, the risks of fetal
aneuploidies are not increased. MSAFP screening showed
low risk for open-neural tube defects.
Recommendations

Follow-up scans as clinically indicated.

## 2019-06-21 ENCOUNTER — Ambulatory Visit: Payer: Medicaid Other

## 2019-07-10 ENCOUNTER — Telehealth: Payer: Self-pay | Admitting: Obstetrics

## 2019-08-02 ENCOUNTER — Ambulatory Visit (INDEPENDENT_AMBULATORY_CARE_PROVIDER_SITE_OTHER): Payer: Medicaid Other

## 2019-08-02 ENCOUNTER — Other Ambulatory Visit: Payer: Self-pay

## 2019-08-02 DIAGNOSIS — N912 Amenorrhea, unspecified: Secondary | ICD-10-CM

## 2019-08-02 DIAGNOSIS — Z3201 Encounter for pregnancy test, result positive: Secondary | ICD-10-CM

## 2019-08-02 DIAGNOSIS — Z3490 Encounter for supervision of normal pregnancy, unspecified, unspecified trimester: Secondary | ICD-10-CM | POA: Insufficient documentation

## 2019-08-02 LAB — POCT URINE PREGNANCY: Preg Test, Ur: POSITIVE — AB

## 2019-08-02 NOTE — Progress Notes (Signed)
Pt presents for pregnancy test. Test today is positive. LMP 06/17/19. Pt is [redacted]w[redacted]d today. EDD 03/23/20.   Pt is not taking medications currently. She is taking prenatal vitamins. She has no concerns. Prenatal vitamin samples given

## 2019-08-03 NOTE — Progress Notes (Signed)
I have reviewed this chart and agree with the RN/CMA assessment and management.    K. Meryl Davis, M.D. Attending [facility] (Faculty Practice)   

## 2019-09-04 ENCOUNTER — Other Ambulatory Visit: Payer: Self-pay

## 2019-09-04 ENCOUNTER — Encounter: Payer: Self-pay | Admitting: Advanced Practice Midwife

## 2019-09-04 ENCOUNTER — Ambulatory Visit (INDEPENDENT_AMBULATORY_CARE_PROVIDER_SITE_OTHER): Payer: Medicaid Other | Admitting: Advanced Practice Midwife

## 2019-09-04 VITALS — Wt 146.0 lb

## 2019-09-04 DIAGNOSIS — Z3481 Encounter for supervision of other normal pregnancy, first trimester: Secondary | ICD-10-CM | POA: Diagnosis not present

## 2019-09-04 DIAGNOSIS — O219 Vomiting of pregnancy, unspecified: Secondary | ICD-10-CM

## 2019-09-04 DIAGNOSIS — O99321 Drug use complicating pregnancy, first trimester: Secondary | ICD-10-CM

## 2019-09-04 DIAGNOSIS — F419 Anxiety disorder, unspecified: Secondary | ICD-10-CM | POA: Insufficient documentation

## 2019-09-04 DIAGNOSIS — O99341 Other mental disorders complicating pregnancy, first trimester: Secondary | ICD-10-CM

## 2019-09-04 DIAGNOSIS — Z3490 Encounter for supervision of normal pregnancy, unspecified, unspecified trimester: Secondary | ICD-10-CM | POA: Diagnosis not present

## 2019-09-04 DIAGNOSIS — O0990 Supervision of high risk pregnancy, unspecified, unspecified trimester: Secondary | ICD-10-CM | POA: Diagnosis not present

## 2019-09-04 DIAGNOSIS — Z9189 Other specified personal risk factors, not elsewhere classified: Secondary | ICD-10-CM

## 2019-09-04 DIAGNOSIS — Z3A11 11 weeks gestation of pregnancy: Secondary | ICD-10-CM

## 2019-09-04 MED ORDER — BLOOD PRESSURE KIT: 1.0000 [IU] | PACK | Freq: Once | 0 refills | Status: AC

## 2019-09-04 MED ORDER — BLOOD PRESSURE CUFF MISC: 1.0000 [IU] | Freq: Once | Status: AC

## 2019-09-04 MED ORDER — BUSPIRONE HCL 5 MG PO TABS: 5.0000 mg | ORAL_TABLET | Freq: Three times a day (TID) | ORAL | 3 refills | Status: DC

## 2019-09-04 MED ORDER — METOCLOPRAMIDE HCL 10 MG PO TABS: 10.0000 mg | ORAL_TABLET | Freq: Three times a day (TID) | ORAL | 2 refills | Status: DC | PRN

## 2019-09-04 NOTE — Progress Notes (Signed)
Subjective:   Hannah Bradford is a 23 y.o. G2P1001 at 8w2dby LMP being seen today for her first obstetrical visit.  Her obstetrical history is significant for previous vaginal delivery, smokes marijuana for nausea and has Former smoker; Postpartum care and examination; and Encounter for supervision of normal pregnancy, unspecified, unspecified trimester on their problem list.. Patient does intend to breast feed. Pregnancy history fully reviewed.  Patient reports anxiety and fatigue.  HISTORY: OB History  Gravida Para Term Preterm AB Living  2 1 1  0 0 1  SAB TAB Ectopic Multiple Live Births  0 0 0 0 1    # Outcome Date GA Lbr Len/2nd Weight Sex Delivery Anes PTL Lv  2 Current           1 Term 09/07/18 334w2d1:21 / 00:12 6 lb 4 oz (2.835 kg) M Vag-Spont EPI  LIV     Name: Scheeler,BOY Analiah     Apgar1: 4  Apgar5: 6   Past Medical History:  Diagnosis Date  . Anxiety   . Medical history non-contributory    Past Surgical History:  Procedure Laterality Date  . NO PAST SURGERIES     Family History  Problem Relation Age of Onset  . Hypertension Maternal Grandmother   . Hypertension Paternal Grandmother   . Diabetes Paternal Grandmother    Social History   Tobacco Use  . Smoking status: Former Smoker    Types: Cigarettes    Quit date: 01/11/2018    Years since quitting: 1.6  . Smokeless tobacco: Never Used  Substance Use Topics  . Alcohol use: Never  . Drug use: Not Currently    Types: Marijuana    Comment: last use Oct 2019   No Known Allergies No current outpatient medications on file prior to visit.   No current facility-administered medications on file prior to visit.     Indications for ASA therapy (per uptodate) One of the following: Previous pregnancy with preeclampsia, especially early onset and with an adverse outcome No Multifetal gestation No Chronic hypertension No Type 1 or 2 diabetes mellitus No Chronic kidney disease No Autoimmune disease  (antiphospholipid syndrome, systemic lupus erythematosus) No   Two or more of the following: Nulliparity No Obesity (body mass index >30 kg/m2) No Family history of preeclampsia in mother or sister No Age ?35 years No Sociodemographic characteristics (African American race, low socioeconomic level) Yes Personal risk factors (eg, previous pregnancy with low birth weight or small for gestational age infant, previous adverse pregnancy outcome [eg, stillbirth], interval >10 years between pregnancies) No   Indications for early 1 hour GTT (per uptodate)  BMI >25 (>23 in Asian women) AND one of the following  Gestational diabetes mellitus in a previous pregnancy No Glycated hemoglobin ?5.7 percent (39 mmol/mol), impaired glucose tolerance, or impaired fasting glucose on previous testing No First-degree relative with diabetes No High-risk race/ethnicity (eg, African American, Latino, Native American, [place], Pacific Islander) No History of cardiovascular disease No Hypertension or on therapy for hypertension No High-density lipoprotein cholesterol level <35 mg/dL (0.90 mmol/L) and/or a triglyceride level >250 mg/dL (2.82 mmol/L) No Polycystic ovary syndrome No Physical inactivity No Other clinical condition associated with insulin resistance (eg, severe obesity, acanthosis nigricans) No Previous birth of an infant weighing ?4000 g No Previous stillbirth of unknown cause No Exam   Vitals:   09/04/19 1030  Weight: 146 lb (66.2 kg)      Uterus:     Pelvic Exam: Perineum:  no hemorrhoids, normal perineum   Vulva: normal external genitalia, no lesions   Vagina:  normal mucosa, normal discharge   Cervix: no lesions and normal, pap smear done.    Adnexa: normal adnexa and no mass, fullness, tenderness   Bony Pelvis: average  System: General: well-developed, well-nourished female in no acute distress   Breast:  normal appearance, no masses or tenderness   Skin: normal coloration  and turgor, no rashes   Neurologic: oriented, normal, negative, normal mood   Extremities: normal strength, tone, and muscle mass, ROM of all joints is normal   HEENT PERRLA, extraocular movement intact and sclera clear, anicteric   Mouth/Teeth mucous membranes moist, pharynx normal without lesions and dental hygiene good   Neck supple and no masses   Cardiovascular: regular rate and rhythm   Respiratory:  no respiratory distress, normal breath sounds   Abdomen: soft, non-tender; bowel sounds normal; no masses,  no organomegaly     Assessment:   Pregnancy: G2P1001 Patient Active Problem List   Diagnosis Date Noted  . Encounter for supervision of normal pregnancy, unspecified, unspecified trimester 08/02/2019  . Postpartum care and examination 10/18/2018  . Former smoker 03/03/2018     Plan:  1. Encounter for supervision of normal pregnancy, antepartum, unspecified gravidity --Anticipatory guidance about next visits/weeks of pregnancy given. --Next visit in 4 weeks in office for AFP --Reviewed history and results, Normal Pap 03/03/18 - Urine Culture - Comp Met ([facility]) - HgB A1c - Cervicovaginal ancillary only( [place]) - Genetic Screening - Babyscripts Schedule Optimization - Obstetric Panel (and HIV)*LC  2. At risk for depression --Pt reports anxiety, is biting her nails.  She thinks she has always been anxious but it is getting worse.  --There is also some depressed mood, she has never talked about it before but has had these feelings for a long time. Doesn't feel like herself like she used to.   --She denies any thoughts of harming herself or others --Relationship stress, she doesn't always feel supported by her boyfriend, sometimes needs more help. She does have supportive family, and lists her grandparents as good support in her life. --She is enrolled in school and starts next week so is planning for the future - Ambulatory referral to [place]  3.  Anxiety during pregnancy in first trimester, antepartum --With no previous diagnosis and no medications for anxiety/depression before, will start with anxiety medication and visit with [facility].   --Discussed antidepressants like Zoloft with pt, how they work, risks/benefits and safety in pregnancy. - Ambulatory referral to [place] - busPIRone (BUSPAR) 5 MG tablet; Take 1 tablet (5 mg total) by mouth 3 (three) times daily.  Dispense: 90 tablet; Refill: 3   4. 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 --She smokes at night mostly, and is likely self medicating for her anxiety too. --Discussed risks today/alternatives and pt agreed to try Reglan 10 mg TID PRN with meals and wean down/off THC. She is to notify office if she needs more treatment for nausea.  5. Nausea and vomiting during pregnancy prior to [redacted] weeks gestation --See above. - metoCLOPramide (REGLAN) 10 MG tablet; Take 1 tablet (10 mg total) by mouth 3 (three) times daily with meals as needed for nausea.  Dispense: 60 tablet; Refill: 2  Initial labs drawn. Continue prenatal vitamins. Discussed and offered genetic screening options, including Quad screen/AFP, NIPS testing, and option  to decline testing. Benefits/risks/alternatives reviewed. Pt aware that anatomy US is form of genetic screening with lower accuracy in detecting trisomies than blood work.  Pt chooses genetic screening today. NIPS: requested. Ultrasound discussed; fetal anatomic survey: requested. Problem list reviewed and updated. The nature of Hand with multiple MDs and other Advanced Practice Providers was explained to patient; also emphasized that residents, students are part of our team. Routine obstetric precautions reviewed. No follow-ups on file.   Fatima Blank, CNM 09/04/19 11:51 AM

## 2019-09-04 NOTE — Patient Instructions (Signed)
Perinatal Anxiety °When a woman feels excessive tension or worry (anxiety) during pregnancy or during the first 12 months after she gives birth, she has a condition called perinatal anxiety. Anxiety can interfere with work, school, relationships, and other everyday activities. If it is not managed properly, it can also cause problems in the mother and her baby.  °If you are pregnant and you have symptoms of an anxiety disorder, it is important to talk with your health care provider. °What are the causes? °The exact cause of this condition is not known. Hormonal changes during and after pregnancy may play a role in causing perinatal anxiety. °What increases the risk? °You are more likely to develop this condition if: °· You have a personal or family history of depression, anxiety, or mood disorders. °· You experience a stressful life event during pregnancy, such as the death of a loved one. °· You have a lot of regular life stress, such as being a single parent. °· You have thyroid problems. °What are the signs or symptoms? °Perinatal anxiety can be different for everyone. It may include: °· Panic attacks (panic disorder). These are intense episodes of fear or discomfort that may also cause sweating, nausea, shortness of breath, or fear of dying. They usually last 5-15 minutes. °· Reliving an upsetting (traumatic) event through distressing thoughts, dreams, or flashbacks (post-traumatic stress disorder, or PTSD). °· Excessive worry about multiple problems (generalized anxiety disorder). °· Fear and stress about leaving certain people or loved ones (separation anxiety). °· Performing repetitive tasks (compulsions) to relieve stress or worry (obsessive compulsive disorder, or OCD). °· Fear of certain objects or situations (phobias). °· Excessive worrying, such as a constant feeling that something bad is going to happen. °· Inability to relax. °· Difficulty concentrating. °· Sleep problems. °· Frequent nightmares or  disturbing thoughts. °How is this diagnosed? °This condition is diagnosed based on a physical exam and mental evaluation. In some cases, your health care provider may use an anxiety screening tool. These tools include a list of questions that can help a health care provider diagnose anxiety. Your health care provider may refer you to a mental health expert who specializes in anxiety. °How is this treated? °This condition may be treated with: °· Medicines. Your health care provider will only give you medicines that have been proven safe for pregnancy and breastfeeding. °· Talk therapy with a mental health professional to help change your patterns of thinking (cognitive behavioral therapy). °· Mindfulness-based stress reduction. °· Other relaxation therapies, such as deep breathing or guided muscle relaxation. °· Support groups. °Follow these instructions at home: °Lifestyle °· Do not use any products that contain nicotine or tobacco, such as cigarettes and e-cigarettes. If you need help quitting, ask your health care provider. °· Do not use alcohol when you are pregnant. After your baby is born, limit alcohol intake to no more than 1 drink a day. One drink equals 12 oz of beer, 5 oz of wine, or 1½ oz of hard liquor. °· Consider joining a support group for new mothers. Ask your health care provider for recommendations. °· Take good care of yourself. Make sure you: °? Get plenty of sleep. If you are having trouble sleeping, talk with your health care provider. °? Eat a healthy diet. This includes plenty of fruits and vegetables, whole grains, and lean proteins. °? Exercise regularly, as told by your health care provider. Ask your health care provider what exercises are safe for you. °General instructions °· Take over-the-counter   and prescription medicines only as told by your health care provider. °· Talk with your partner or family members about your feelings during pregnancy. Share any concerns or fears that you may  have. °· Ask for help with tasks or chores when you need it. Ask friends and family members to provide meals, watch your children, or help with cleaning. °· Keep all follow-up visits as told by your health care provider. This is important. °Contact a health care provider if: °· You (or people close to you) notice that you have any symptoms of anxiety or depression. °· You have anxiety and your symptoms get worse. °· You experience side effects from medicines, such as nausea or sleep problems. °Get help right away if: °· You feel like hurting yourself, your baby, or someone else. °If you ever feel like you may hurt yourself or others, or have thoughts about taking your own life, get help right away. You can go to your nearest emergency department or call: °· Your local emergency services (911 in the [place] °· A suicide crisis helpline, such as the National Suicide Prevention Lifeline at 1-800-273-8255. This is open 24 hours a day. °Summary °· Perinatal anxiety is when a woman feels excessive tension or worry during pregnancy or during the first 12 months after she gives birth. °· Perinatal anxiety may include panic attacks, post-traumatic stress disorder, separation anxiety, phobias, or generalized anxiety. °· Perinatal anxiety can cause physical health problems in the mother and baby if not properly managed. °· This condition is treated with medicines, talk therapy, stress reduction therapies, or a combination of two or more treatments. °· Talk with your partner or family members about your concerns or fears. Do not be afraid to ask for help. °This information is not intended to replace advice given to you by your health care provider. Make sure you discuss any questions you have with your health care provider. °Document Released: 10/27/2016 Document Revised: 09/02/2017 Document Reviewed: 10/27/2016 °Elsevier Patient Education © 2020 [employer] ° °

## 2019-09-05 LAB — OBSTETRIC PANEL, INCLUDING HIV
Antibody Screen: NEGATIVE
Basophils Absolute: 0 10*3/uL (ref 0.0–0.2)
Basos: 0 %
EOS (ABSOLUTE): 0.2 10*3/uL (ref 0.0–0.4)
Eos: 2 %
HIV Screen 4th Generation wRfx: NONREACTIVE
Hematocrit: 37.6 % (ref 34.0–46.6)
Hemoglobin: 12.2 g/dL (ref 11.1–15.9)
Hepatitis B Surface Ag: NEGATIVE
Immature Grans (Abs): 0 10*3/uL (ref 0.0–0.1)
Immature Granulocytes: 0 %
Lymphocytes Absolute: 1.6 10*3/uL (ref 0.7–3.1)
Lymphs: 18 %
MCH: 25.3 pg — ABNORMAL LOW (ref 26.6–33.0)
MCHC: 32.4 g/dL (ref 31.5–35.7)
MCV: 78 fL — ABNORMAL LOW (ref 79–97)
Monocytes Absolute: 0.5 10*3/uL (ref 0.1–0.9)
Monocytes: 6 %
Neutrophils Absolute: 6.5 10*3/uL (ref 1.4–7.0)
Neutrophils: 74 %
Platelets: 306 10*3/uL (ref 150–450)
RBC: 4.82 x10E6/uL (ref 3.77–5.28)
RDW: 15.9 % — ABNORMAL HIGH (ref 11.7–15.4)
RPR Ser Ql: NONREACTIVE
Rh Factor: POSITIVE
Rubella Antibodies, IGG: 4.45 index (ref >0.99)
WBC: 8.9 10*3/uL (ref 3.4–10.8)

## 2019-09-05 LAB — COMPREHENSIVE METABOLIC PANEL
ALT: 10 IU/L (ref 0–32)
AST: 14 IU/L (ref 0–40)
Albumin/Globulin Ratio: 1.6 (ref 1.2–2.2)
Albumin: 4.4 g/dL (ref 3.9–5.0)
Alkaline Phosphatase: 77 IU/L (ref 39–117)
BUN/Creatinine Ratio: 20 (ref 9–23)
BUN: 9 mg/dL (ref 6–20)
Bilirubin Total: <0.2 mg/dL (ref 0.0–1.2)
CO2: 21 mmol/L (ref 20–29)
Calcium: 10 mg/dL (ref 8.7–10.2)
Chloride: 102 mmol/L (ref 96–106)
Creatinine, Ser: 0.46 mg/dL — ABNORMAL LOW (ref 0.57–1.00)
GFR calc Af Amer: 163 mL/min/{1.73_m2} (ref >59)
GFR calc non Af Amer: 142 mL/min/{1.73_m2} (ref >59)
Globulin, Total: 2.7 g/dL (ref 1.5–4.5)
Glucose: 85 mg/dL (ref 65–99)
Potassium: 4.3 mmol/L (ref 3.5–5.2)
Sodium: 138 mmol/L (ref 134–144)
Total Protein: 7.1 g/dL (ref 6.0–8.5)

## 2019-09-05 LAB — HEMOGLOBIN A1C
Est. average glucose Bld gHb Est-mCnc: 97 mg/dL
Hgb A1c MFr Bld: 5 % (ref 4.8–5.6)

## 2019-09-05 NOTE — BH Specialist Note (Signed)
Pt did not arrive to video visit and husband answered the phone, unable to leave message as phone cut off ; sent both text and email invitation to virtual visit; left MyChart message for patient.   [place] via Telemedicine Video Visit  09/05/2019 Kayti Poss [phone]  Garlan Fair

## 2019-09-06 LAB — URINE CULTURE

## 2019-09-12 ENCOUNTER — Encounter: Payer: Self-pay | Admitting: Advanced Practice Midwife

## 2019-09-13 ENCOUNTER — Encounter: Payer: Self-pay | Admitting: Advanced Practice Midwife

## 2019-09-18 ENCOUNTER — Other Ambulatory Visit: Payer: Self-pay

## 2019-09-18 ENCOUNTER — Ambulatory Visit: Payer: Medicaid Other | Admitting: Clinical

## 2019-09-18 DIAGNOSIS — Z91199 Patient's noncompliance with other medical treatment and regimen due to unspecified reason: Secondary | ICD-10-CM

## 2019-09-18 DIAGNOSIS — Z5329 Procedure and treatment not carried out because of patient's decision for other reasons: Secondary | ICD-10-CM

## 2019-09-21 DIAGNOSIS — O0990 Supervision of high risk pregnancy, unspecified, unspecified trimester: Secondary | ICD-10-CM | POA: Diagnosis not present

## 2019-09-29 IMAGING — US US ABDOMEN LIMITED
1 series · 15 of 25 positions shown · non-contrast
Comparison: No priors.

CLINICAL DATA: 22-year-old female with history of right upper
quadrant abdominal pain since yesterday. Nausea and vomiting.

EXAM:
ULTRASOUND ABDOMEN LIMITED RIGHT UPPER QUADRANT

[Series 1: us abdomen limited · 15 of 35 slices shown]
[im 1/35]
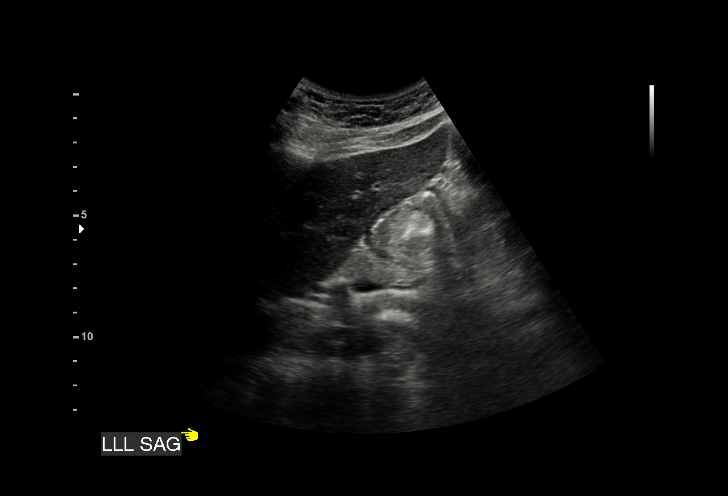
[im 3/35]
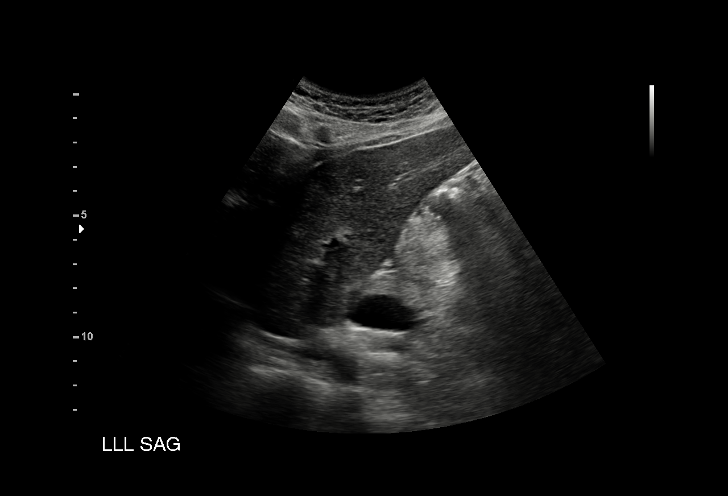
[im 6/35]
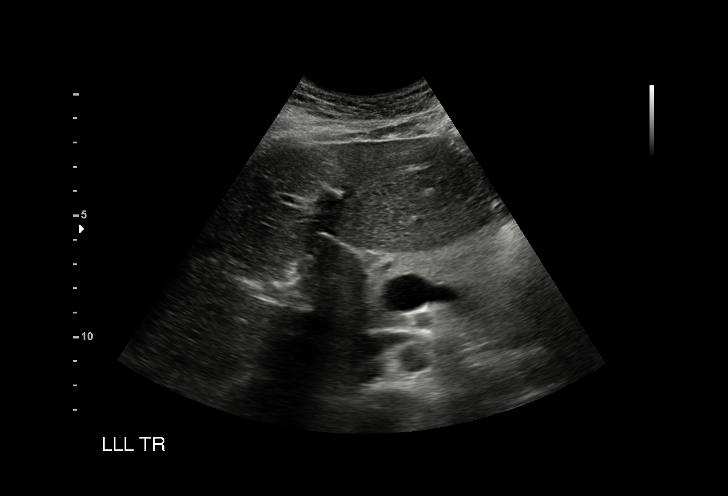
[im 8/35]
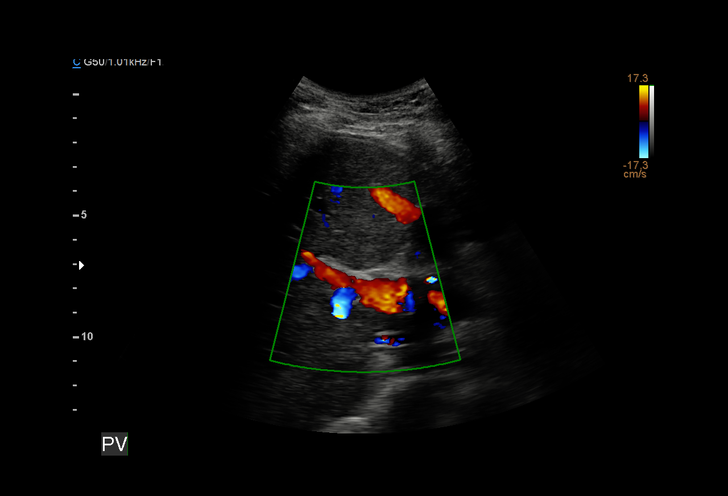
[im 10/35]
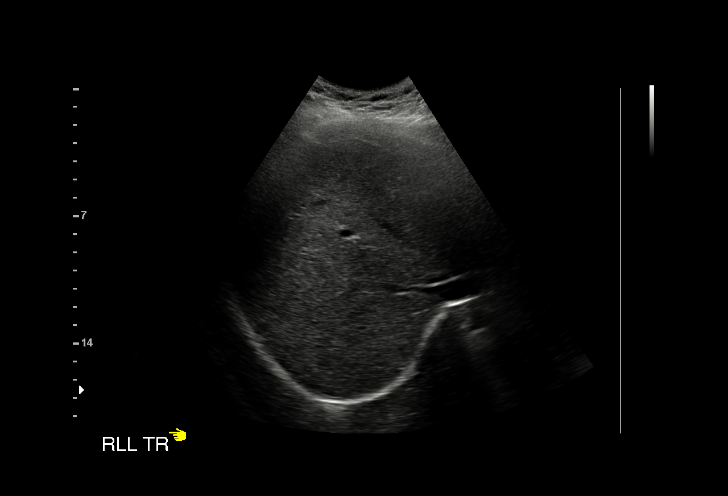
[im 13/35]
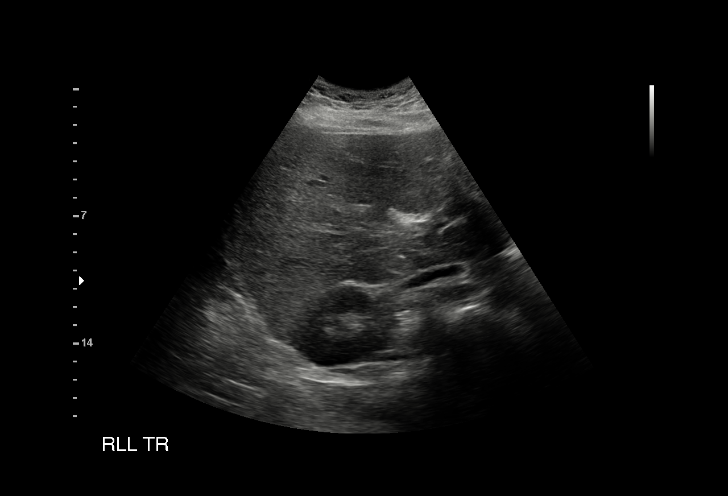
[im 15/35]
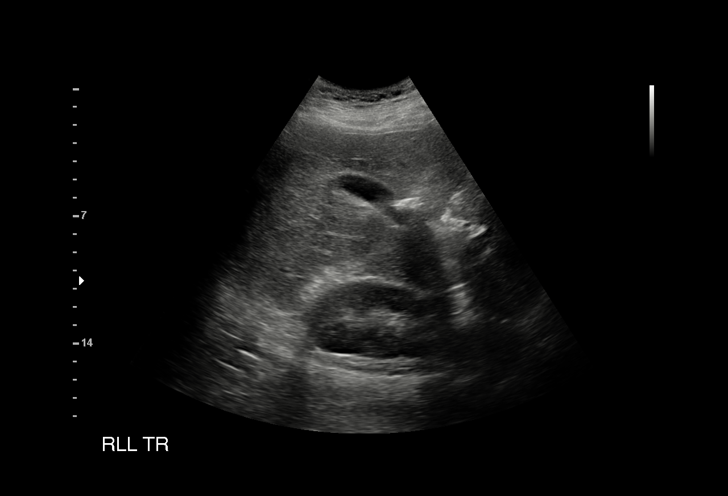
[im 18/35]
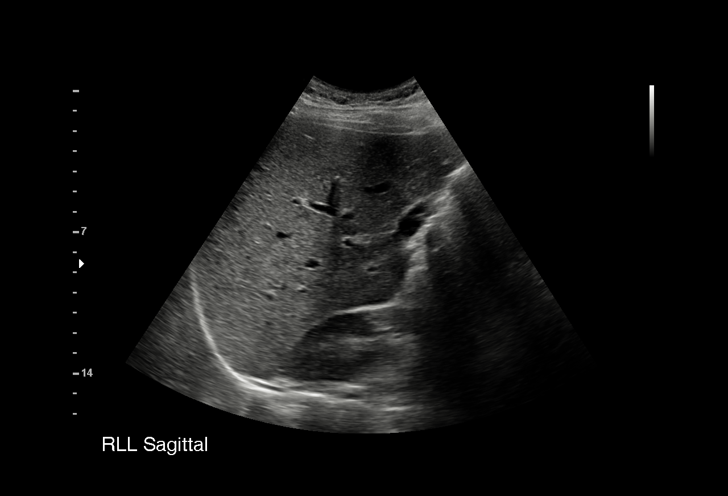
[im 20/35]
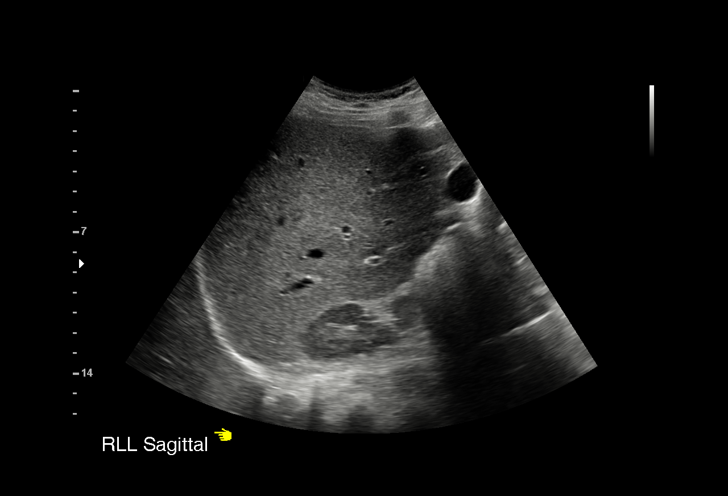
[im 22/35]
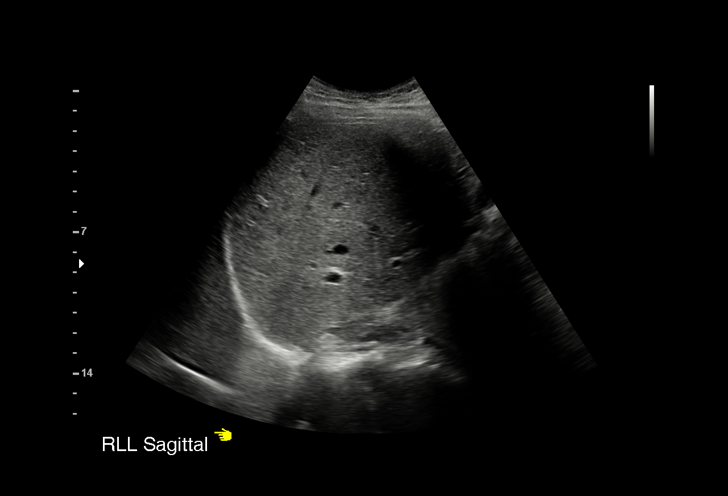
[im 25/35]
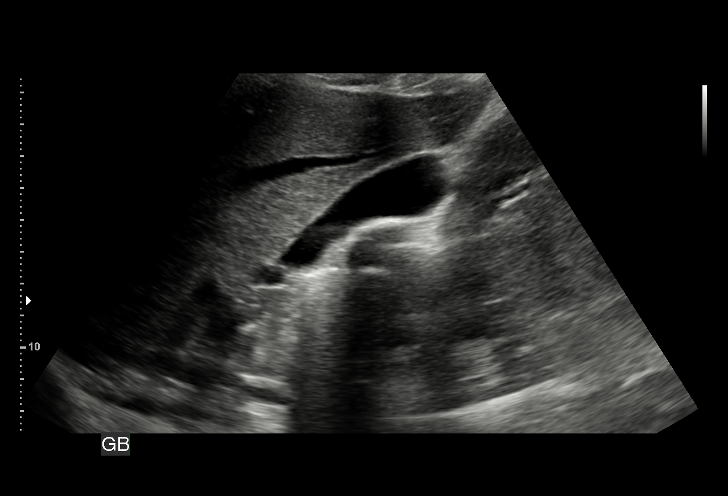
[im 27/35]
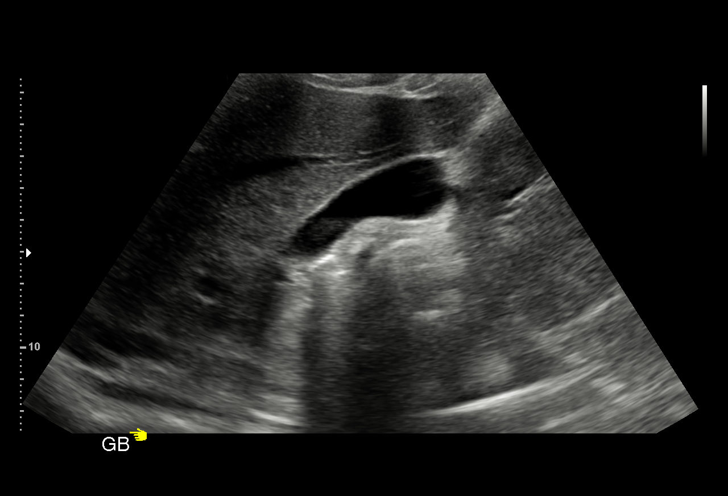
[im 29/35]
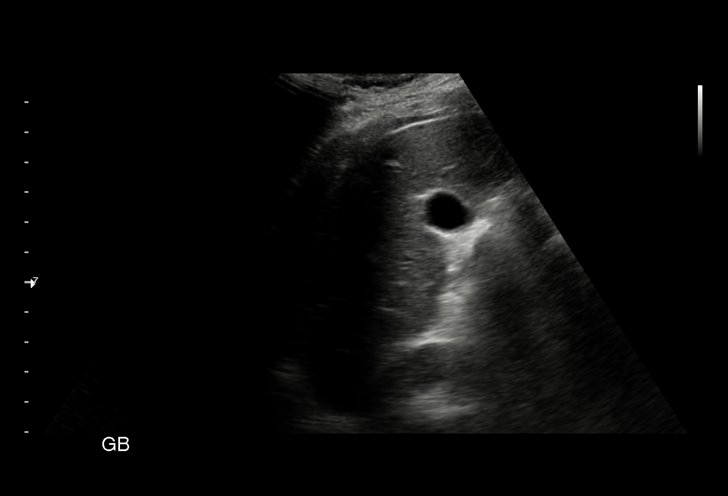
[im 32/35]
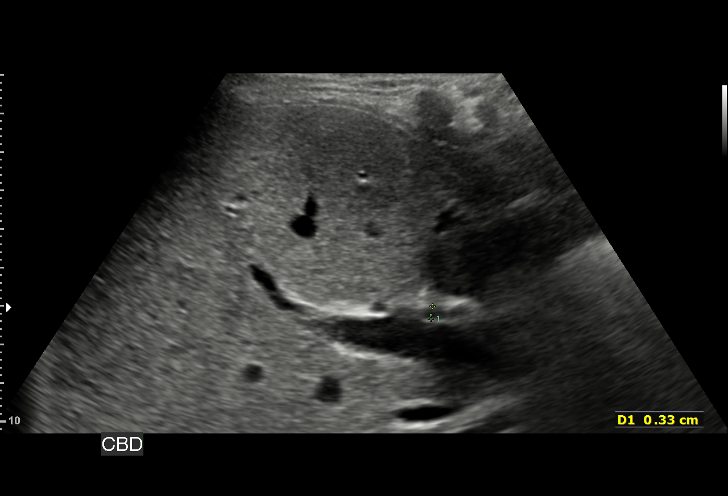
[im 35/35]
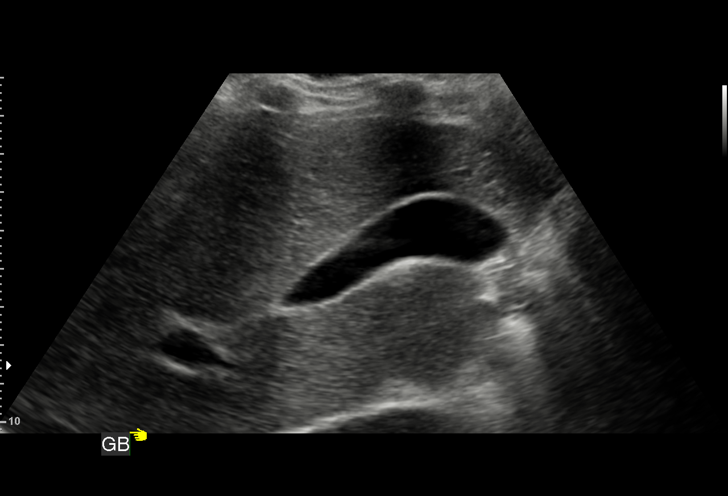

[15 of 25 positions shown; findings below may reference images not displayed]

FINDINGS: Gallbladder:

No gallstones or wall thickening visualized. No sonographic Murphy
sign noted by sonographer.

Common bile duct:

Diameter: 3.3 mm in the porta hepatis.

Liver:

No focal lesion identified. Within normal limits in parenchymal
echogenicity. Portal vein is patent on color Doppler imaging with
normal direction of blood flow towards the liver.
IMPRESSION: 1. No acute findings are noted. Specifically, no gallstones or
findings to suggest an acute cholecystitis at this time.

## 2019-10-01 ENCOUNTER — Encounter: Payer: Medicaid Other | Admitting: Advanced Practice Midwife

## 2019-10-08 ENCOUNTER — Ambulatory Visit (INDEPENDENT_AMBULATORY_CARE_PROVIDER_SITE_OTHER): Payer: Medicaid Other | Admitting: Advanced Practice Midwife

## 2019-10-08 ENCOUNTER — Other Ambulatory Visit: Payer: Self-pay

## 2019-10-08 VITALS — BP 120/79 | HR 128 | Wt 147.8 lb

## 2019-10-08 DIAGNOSIS — Z3A16 16 weeks gestation of pregnancy: Secondary | ICD-10-CM

## 2019-10-08 DIAGNOSIS — F419 Anxiety disorder, unspecified: Secondary | ICD-10-CM

## 2019-10-08 DIAGNOSIS — Z3A19 19 weeks gestation of pregnancy: Secondary | ICD-10-CM

## 2019-10-08 DIAGNOSIS — O99342 Other mental disorders complicating pregnancy, second trimester: Secondary | ICD-10-CM

## 2019-10-08 DIAGNOSIS — Z3480 Encounter for supervision of other normal pregnancy, unspecified trimester: Secondary | ICD-10-CM

## 2019-10-08 DIAGNOSIS — O219 Vomiting of pregnancy, unspecified: Secondary | ICD-10-CM

## 2019-10-08 NOTE — Progress Notes (Signed)
   PRENATAL VISIT NOTE  Subjective:  Hannah Bradford is a 24 y.o. G2P1001 at [redacted]w[redacted]d being seen today for ongoing prenatal care.  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  Patient reports no bleeding, no contractions, no cramping and no leaking. She says her nausea has abated and has not had to use her Reglan prescription. She also says her anxiety has improved following increased communication with her support persons, and has not used the buspirone prescribed. Patient reports fluttering fetal movement.   Contractions: Not present. Vag. Bleeding: None.   . Denies leaking of fluid.   The following portions of the patient's history were reviewed and updated as appropriate: allergies, current medications, past family history, past medical history, past social history, past surgical history and problem list.   Objective:   Vitals:   10/08/19 1404  BP: 120/79  Pulse: (!) 128  Weight: 147 lb 12.8 oz (67 kg)    Fetal Status: Fetal Heart Rate (bpm): 140         General:  Alert, oriented and cooperative. Patient is in no acute distress.  Skin: Skin is warm and dry. No rash noted.   Cardiovascular: Normal heart rate noted  Respiratory: Normal respiratory effort, no problems with respiration noted  Abdomen: Soft, gravid, appropriate for gestational age.  Pain/Pressure: Present     Pelvic: Cervical exam deferred        Extremities: Normal range of motion.  Edema: None  Mental Status: Normal mood and affect. Normal behavior. Normal judgment and thought content.   Assessment and Plan:  Pregnancy: G2P1001 at [redacted]w[redacted]d  1. Supervision of other normal pregnancy, antepartum - AFP,  Serum, Open Spina Bifida - [place] MFM OB COMP + 14 WK; Future  2. Anxiety during pregnancy in first trimester, antepartum - Patient states anxiety is much improved following increased communication with loved ones and denies having to use her buspirone prescription.  - Counseled patient to notify provider if she desires further mental health resources, including medication or counseling  4. Nausea and vomiting during pregnancy prior to [redacted] weeks gestation - Nausea and vomiting have abated and patient reports only minimum use of Reglan prescription.   There are no diagnoses linked to this encounter. Preterm labor symptoms and general obstetric precautions including but not limited to vaginal bleeding, contractions, leaking of fluid and fetal movement were reviewed in detail with the patient. Please refer to After Visit Summary for other counseling recommendations.   Follow up anatomy scan in ~3 wks.   No follow-ups on file.  Future Appointments  Date Time Provider Department Center  10/30/2019  1:30 PM [facility] [place] 1 [facility]  11/05/2019  2:00 PM Burleson, Brand Males, NP [facility] None     Georgiana Spinner, Medical Student

## 2019-10-08 NOTE — Progress Notes (Signed)
Pt is here for ROB, [redacted]w[redacted]d.

## 2019-10-10 LAB — AFP, SERUM, OPEN SPINA BIFIDA
AFP MoM: 1.53
AFP Value: 52.3 ng/mL
Gest. Age on Collection Date: 16.1 weeks
Maternal Age At EDD: 23.3 yr
OSBR Risk 1 IN: 2523
Test Results:: NEGATIVE
Weight: 147 [lb_av]

## 2019-10-17 ENCOUNTER — Telehealth: Payer: Medicaid Other | Admitting: Nurse Practitioner

## 2019-10-17 ENCOUNTER — Encounter (HOSPITAL_COMMUNITY): Payer: Self-pay

## 2019-10-17 ENCOUNTER — Emergency Department (HOSPITAL_BASED_OUTPATIENT_CLINIC_OR_DEPARTMENT_OTHER): Payer: Medicaid Other

## 2019-10-17 ENCOUNTER — Emergency Department (HOSPITAL_COMMUNITY): Payer: Medicaid Other

## 2019-10-17 ENCOUNTER — Telehealth: Payer: Self-pay

## 2019-10-17 ENCOUNTER — Emergency Department (HOSPITAL_COMMUNITY)
Admission: EM | Admit: 2019-10-17 | Discharge: 2019-10-17 | Disposition: A | Discharge status: Home / Self Care | Payer: Medicaid Other | Attending: Emergency Medicine | Admitting: Emergency Medicine

## 2019-10-17 DIAGNOSIS — R109 Unspecified abdominal pain: Secondary | ICD-10-CM | POA: Diagnosis not present

## 2019-10-17 DIAGNOSIS — Z87891 Personal history of nicotine dependence: Secondary | ICD-10-CM | POA: Insufficient documentation

## 2019-10-17 DIAGNOSIS — Z79899 Other long term (current) drug therapy: Secondary | ICD-10-CM | POA: Insufficient documentation

## 2019-10-17 DIAGNOSIS — R0789 Other chest pain: Secondary | ICD-10-CM | POA: Diagnosis not present

## 2019-10-17 DIAGNOSIS — O26892 Other specified pregnancy related conditions, second trimester: Secondary | ICD-10-CM | POA: Insufficient documentation

## 2019-10-17 DIAGNOSIS — Z3A18 18 weeks gestation of pregnancy: Secondary | ICD-10-CM | POA: Insufficient documentation

## 2019-10-17 DIAGNOSIS — O99891 Other specified diseases and conditions complicating pregnancy: Secondary | ICD-10-CM | POA: Diagnosis not present

## 2019-10-17 DIAGNOSIS — R102 Pelvic and perineal pain: Secondary | ICD-10-CM | POA: Diagnosis not present

## 2019-10-17 DIAGNOSIS — Z3A17 17 weeks gestation of pregnancy: Secondary | ICD-10-CM

## 2019-10-17 DIAGNOSIS — M546 Pain in thoracic spine: Secondary | ICD-10-CM | POA: Insufficient documentation

## 2019-10-17 DIAGNOSIS — R0602 Shortness of breath: Secondary | ICD-10-CM | POA: Diagnosis not present

## 2019-10-17 LAB — URINALYSIS, ROUTINE W REFLEX MICROSCOPIC
Bilirubin Urine: NEGATIVE
Glucose, UA: NEGATIVE mg/dL
Hgb urine dipstick: NEGATIVE
Ketones, ur: NEGATIVE mg/dL
Nitrite: NEGATIVE
Protein, ur: NEGATIVE mg/dL
Specific Gravity, Urine: 1.024 (ref 1.005–1.030)
pH: 8 (ref 5.0–8.0)

## 2019-10-17 LAB — WET PREP, GENITAL
Clue Cells Wet Prep HPF POC: NONE SEEN
Sperm: NONE SEEN
Trich, Wet Prep: NONE SEEN
Yeast Wet Prep HPF POC: NONE SEEN

## 2019-10-17 MED ORDER — CYCLOBENZAPRINE HCL 10 MG PO TABS: 10.0000 mg | ORAL_TABLET | Freq: Three times a day (TID) | ORAL | 0 refills | Status: DC | PRN

## 2019-10-17 MED ORDER — NAPROXEN 500 MG PO TABS: 500.0000 mg | ORAL_TABLET | Freq: Two times a day (BID) | ORAL | 0 refills | Status: DC

## 2019-10-17 MED ORDER — CEFTRIAXONE SODIUM 500 MG IJ SOLR
500.0000 mg | Freq: Once | INTRAMUSCULAR | Status: AC
  Administered 2019-10-17: 500 mg via INTRAMUSCULAR
  Filled 2019-10-17: qty 500

## 2019-10-17 MED ORDER — LIDOCAINE HCL (PF) 1 % IJ SOLN
INTRAMUSCULAR | Status: AC
  Filled 2019-10-17: qty 5

## 2019-10-17 MED ORDER — AZITHROMYCIN 250 MG PO TABS
1000.0000 mg | ORAL_TABLET | Freq: Once | ORAL | Status: AC
  Administered 2019-10-17: 1000 mg via ORAL
  Filled 2019-10-17: qty 4

## 2019-10-17 NOTE — ED Provider Notes (Signed)
I received this patient in signout from Dr. Adela Lank.  She was known second trimester pregnancy presenting with multiple complaints including musculoskeletal chest pain and abdominal cramping.  At time of signout, Dr. Adela Lank had noted discharge on pelvic exam and spoken with OB/GYN; they had recommended ceftriaxone and azithromycin and will see the patient closely in the clinic.  Awaiting ultrasound.  Ultrasound shows 18-week 3-day intrauterine pregnancy with normal fetal heart tones at 148.  No adnexal abnormalities and no other acute problems.  Patient is comfortable and smiling on reassessment.  I discussed ultrasound results and need for close OB/GYN follow-up.  I have extensively reviewed return precautions and she voiced understanding.   Demorio Seeley, Ambrose Finland, MD 10/17/19 [phone]

## 2019-10-17 NOTE — ED Triage Notes (Signed)
[redacted] weeks pregnant [facility] [birth year] G2P1, woke up at 1am with chest pressure, shortness of breath, pt returned to sleep, woke up at 5am with abd cramping q 20-30 min lasting 3 minutes and back pain-from neck to lower back.  Pt able to talk in complete sentences and every so often will take a deep breath.   No vaginal bleeding, leaking fluid.  1st delivery vag @ 37 weeks, complications- head out of canal x 3 hours before baby was born.   LOV: last week no cervical done, no complications with this pregnancy.

## 2019-10-17 NOTE — Progress Notes (Signed)

## 2019-10-17 NOTE — Telephone Encounter (Signed)
Pt called and reports that she has been having symptoms of chest pain and trouble breathing since last night. She denies fever and coughing. She reports that she submitted an e-visit but would like to know if she should wait for that provider to respond or go to the hospital. I advised pt to go to the hospital immediately or call 911 if she is having difficulty breathing. Pt voices understanding and says she will go.

## 2019-10-17 NOTE — ED Notes (Signed)
Walked patient to the bathroom patient did well patient is back on the monitor called bell in reach

## 2019-10-17 NOTE — ED Provider Notes (Signed)
[facility] EMERGENCY DEPARTMENT Provider Note   CSN: [id number] Arrival date & time: 10/17/19  1213     History No chief complaint on file.   Hannah Bradford is a 24 y.o. female.  24 yo F with a chief complaints of chest pain back pain and shortness of breath.  This been going on for about 48 hours.  Worse with deep breathing moving and twisting.  Got worse overnight.  She had a virtual visit today and had an in person visit scheduled 2 hours later but she felt she could not wait that long and so came to the ED for evaluation.  She denies cough congestion or fever.  Felt like she is having some lower abdominal cramping.  Has had a scant amount of discharge.  No vaginal bleeding no gross fluids.  No specific urinary symptoms.  This is the patient's second pregnancy.  First pregnancy was without significant complication.  She told me at one point that whenever she felt anything was remotely wrong she went immediately to the hospital.  The history is provided by the patient.  Illness Severity:  Mild Onset quality:  Gradual Duration:  2 days Timing:  Constant Progression:  Worsening Chronicity:  New Associated symptoms: chest pain   Associated symptoms: no congestion, no fever, no headaches, no myalgias, no nausea, no rhinorrhea, no shortness of breath, no vomiting and no wheezing        Past Medical History:  Diagnosis Date  . Anxiety   . Medical history non-contributory     Patient Active Problem List   Diagnosis Date Noted  . Anxiety during pregnancy in first trimester, antepartum 09/04/2019  . Encounter for supervision of normal pregnancy, unspecified, unspecified trimester 08/02/2019  . Postpartum care and examination 10/18/2018  . 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 Former smoker 03/03/2018    Past Surgical History:  Procedure Laterality Date  . NO PAST SURGERIES       OB History    Gravida  2   Para  1   Term  1   Preterm      AB      Living  1     SAB      TAB      Ectopic      Multiple  0   Live Births  1           Family History  Problem Relation Age of Onset  . Hypertension Maternal Grandmother   . Hypertension Paternal Grandmother   . Diabetes Paternal Grandmother     Social History   Tobacco Use  . Smoking status: Former Smoker    Types: Cigarettes    Quit date: 01/11/2018    Years since quitting: 1.7  . Smokeless tobacco: Never Used  Substance Use Topics  . Alcohol use: Never  . Drug use: Not Currently    Types: Marijuana    Comment: last use Oct 2019    Home Medications Prior to Admission medications   Medication Sig Start Date End Date Taking? Authorizing Provider  Prenatal Vit-Fe Fumarate-FA (PRENATAL VITAMINS PO) Take 1 tablet by mouth daily with breakfast.    Yes [provider]  busPIRone (BUSPAR) 5 MG tablet Take 1 tablet (5 mg total) by mouth 3 (three) times daily. Patient not taking: Reported on 10/17/2019 09/04/19   Sharen Counter A, CNM  cyclobenzaprine (FLEXERIL) 10 MG tablet Take 1 tablet (10 mg total) by mouth 3 (three)  times daily as needed for muscle spasms. 10/17/19   Hassell Done Mary-Margaret, FNP  metoCLOPramide (REGLAN) 10 MG tablet Take 1 tablet (10 mg total) by mouth 3 (three) times daily with meals as needed for nausea. Patient not taking: Reported on 10/17/2019 09/04/19   Fatima Blank A, CNM  naproxen (NAPROSYN) 500 MG tablet Take 1 tablet (500 mg total) by mouth 2 (two) times daily with a meal. 10/17/19   Chevis Pretty, FNP    Allergies    Patient has no known allergies.  Review of Systems   Review of Systems  Constitutional: Negative for chills and fever.  HENT: Negative for congestion and rhinorrhea.   Eyes: Negative for redness and visual disturbance.  Respiratory: Negative for shortness of breath and wheezing.   Cardiovascular: Positive for chest pain. Negative for palpitations.    Gastrointestinal: Negative for nausea and vomiting.  Genitourinary: Negative for dysuria and urgency.  Musculoskeletal: Positive for back pain. Negative for arthralgias and myalgias.  Skin: Negative for pallor and wound.  Neurological: Negative for dizziness and headaches.    Physical Exam Updated Vital Signs BP 116/78 (BP Location: Right Arm)   Pulse 91   Temp 97.8 F (36.6 C) (Oral)   Resp 18   LMP 06/17/2019   SpO2 98%   Physical Exam Vitals and nursing note reviewed.  Constitutional:      General: She is not in acute distress.    Appearance: She is well-developed. She is not diaphoretic.  HENT:     Head: Normocephalic and atraumatic.  Eyes:     Pupils: Pupils are equal, round, and reactive to light.  Cardiovascular:     Rate and Rhythm: Normal rate and regular rhythm.     Heart sounds: No murmur. No friction rub. No gallop.   Pulmonary:     Effort: Pulmonary effort is normal.     Breath sounds: No wheezing or rales.  Abdominal:     General: There is no distension.     Palpations: Abdomen is soft.     Tenderness: There is no abdominal tenderness. There is no guarding.  Musculoskeletal:        General: Tenderness present.     Cervical back: Normal range of motion and neck supple.     Comments: Diffusely tender on palpation of the anterior chest wall.  Also tender to the thoracic back diffusely worse on the right than the left.  Skin:    General: Skin is warm and dry.  Neurological:     Mental Status: She is alert and oriented to person, place, and time.  Psychiatric:        Behavior: Behavior normal.     ED Results / Procedures / Treatments   Labs (all labs ordered are listed, but only abnormal results are displayed) Labs Reviewed  WET PREP, GENITAL - Abnormal; Notable for the following components:      Result Value   WBC, Wet Prep HPF POC MANY (*)    All other components within normal limits  URINALYSIS, ROUTINE W REFLEX MICROSCOPIC - Abnormal; Notable  for the following components:   APPearance HAZY (*)    Leukocytes,Ua MODERATE (*)    Bacteria, UA RARE (*)    All other components within normal limits  GC/CHLAMYDIA PROBE AMP ([place]) NOT AT [facility]    EKG EKG Interpretation  Date/Time:  Wednesday October 17 2019 12:25:58 EST Ventricular Rate:  119 PR Interval:  172 QRS Duration: 78 QT Interval:  296 QTC Calculation: 416  R Axis:   69 Text Interpretation: Sinus tachycardia Nonspecific T wave abnormality Abnormal ECG Confirmed by Gerhard Munch [id number]) on 10/17/2019 12:35:19 PM   Radiology DG Chest Port 1 View  Result Date: 10/17/2019 CLINICAL DATA:  Onset shortness of breath this morning. EXAM: PORTABLE CHEST 1 VIEW COMPARISON:  None. FINDINGS: Lungs clear. Heart size normal. No pneumothorax or pleural fluid. No acute or focal bony abnormality. IMPRESSION: Negative chest. Electronically Signed   By: Drusilla Kanner M.D.   On: 10/17/2019 13:15    Procedures Procedures (including critical care time)  Medications Ordered in ED Medications  azithromycin (ZITHROMAX) tablet 1,000 mg (has no administration in time range)  cefTRIAXone (ROCEPHIN) injection 500 mg (has no administration in time range)    ED Course  I have reviewed the triage vital signs and the nursing notes.  Pertinent labs & imaging results that were available during my care of the patient were reviewed by me and considered in my medical decision making (see chart for details).    MDM Rules/Calculators/A&P                      24 yo F with a chief complaints of chest pain and shortness of breath.  This been going on for about 2 days.  Worsening today.  She was seen as a virtual visit and it was thought to be musculoskeletal in nature and she was started on NSAIDs and muscle relaxants.  She had a follow-up visit but felt that she was unable to wait that long so came to the ED.  On my exam she appears anxious.  She was tachycardic on arrival but has improved  significantly for the time of my exam.  She is diffusely tender on palpation which makes pulmonary embolism less likely.  She is 100% on room air.  EKG without any significant change from prior.  Will obtain a chest x-ray, UA.  Perform a pelvic exam.  Reassess.  The exam is concerning for possible pelvic infection.  No obvious cervical motion tenderness.  Not overtly febrile here.  I discussed case with Dr. Estanislado Pandy, imaging plan.  Felt that it was okay for follow-up in the office.  Recommended giving a dose of antibiotics here to treat for possible STDs.  Awaiting [place].    Signed out to Dr. Clarene Duke, please see her note for further details of care.   The patients results and plan were reviewed and discussed.   Any x-rays performed were independently reviewed by myself.   Differential diagnosis were considered with the presenting HPI.  Medications  azithromycin (ZITHROMAX) tablet 1,000 mg (has no administration in time range)  cefTRIAXone (ROCEPHIN) injection 500 mg (has no administration in time range)    Vitals:   10/17/19 1300 10/17/19 1315 10/17/19 1330 10/17/19 1345  BP:      Pulse: (!) 103 (!) 105 94 91  Resp: 17 20 (!) 22 18  Temp:      TempSrc:      SpO2: 100% 100% 100% 98%    Final diagnoses:  Pelvic pain in pregnancy, antepartum, second trimester      Final Clinical Impression(s) / ED Diagnoses Final diagnoses:  Pelvic pain in pregnancy, antepartum, second trimester    Rx / DC Orders ED Discharge Orders    None       Melene Plan, DO 10/17/19 1523

## 2019-10-18 LAB — GC/CHLAMYDIA PROBE AMP (~~LOC~~) NOT AT ARMC
Chlamydia: NEGATIVE
Neisseria Gonorrhea: NEGATIVE

## 2019-10-19 ENCOUNTER — Encounter: Payer: Medicaid Other | Admitting: Nurse Practitioner

## 2019-10-19 LAB — URINE CULTURE

## 2019-10-30 ENCOUNTER — Ambulatory Visit (HOSPITAL_COMMUNITY)
Admission: RE | Admit: 2019-10-30 | Payer: Medicaid Other | Source: Ambulatory Visit | Attending: Advanced Practice Midwife | Admitting: Advanced Practice Midwife

## 2019-11-05 ENCOUNTER — Telehealth (INDEPENDENT_AMBULATORY_CARE_PROVIDER_SITE_OTHER): Payer: Medicaid Other | Admitting: Nurse Practitioner

## 2019-11-05 DIAGNOSIS — Z5329 Procedure and treatment not carried out because of patient's decision for other reasons: Secondary | ICD-10-CM

## 2019-11-05 DIAGNOSIS — Z91199 Patient's noncompliance with other medical treatment and regimen due to unspecified reason: Secondary | ICD-10-CM

## 2019-11-05 NOTE — Progress Notes (Signed)
Attempted to call pt to get her virtual visit started, no answer. LVM

## 2019-11-21 ENCOUNTER — Telehealth: Payer: Medicaid Other | Admitting: Obstetrics and Gynecology

## 2019-11-22 ENCOUNTER — Encounter: Payer: Self-pay | Admitting: Obstetrics

## 2019-11-22 ENCOUNTER — Telehealth (INDEPENDENT_AMBULATORY_CARE_PROVIDER_SITE_OTHER): Payer: Medicaid Other | Admitting: Obstetrics

## 2019-11-22 VITALS — BP 124/72 | HR 92

## 2019-11-22 DIAGNOSIS — Z3A22 22 weeks gestation of pregnancy: Secondary | ICD-10-CM

## 2019-11-22 DIAGNOSIS — Z3482 Encounter for supervision of other normal pregnancy, second trimester: Secondary | ICD-10-CM

## 2019-11-22 DIAGNOSIS — Z3480 Encounter for supervision of other normal pregnancy, unspecified trimester: Secondary | ICD-10-CM

## 2019-11-22 NOTE — Progress Notes (Addendum)
   TELEHEALTH OBSTETRICS PRENATAL VIRTUAL VIDEO VISIT ENCOUNTER NOTE  Provider location: Center for [facility] [place]   I connected with Hannah Bradford on 11/22/19 at 11:15 AM EST by OB MyChart Video Encounter at home and verified that I am speaking with the correct person using two identifiers.   I discussed the limitations, risks, security and privacy concerns of performing an evaluation and management service virtually and the availability of in person appointments. I also discussed with the patient that there may be a patient responsible charge related to this service. The patient expressed understanding and agreed to proceed. Subjective:  Hannah Bradford is a 24 y.o. G2P1001 at [redacted]w[redacted]d being seen today for ongoing prenatal care.  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  Patient reports heartburn.  Contractions: Irritability. Vag. Bleeding: None.  Movement: Present. Denies any leaking of fluid.   The following portions of the patient's history were reviewed and updated as appropriate: allergies, current medications, past family history, past medical history, past social history, past surgical history and problem list.   Objective:   Vitals:   11/22/19 1108  BP: 124/72  Pulse: 92    Fetal Status:     Movement: Present     General:  Alert, oriented and cooperative. Patient is in no acute distress.  Respiratory: Normal respiratory effort, no problems with respiration noted  Mental Status: Normal mood and affect. Normal behavior. Normal judgment and thought content.  Rest of physical exam deferred due to type of encounter  Imaging: No results  found.  Assessment and Plan:  Pregnancy: G2P1001 at [redacted]w[redacted]d 1. Supervision of other normal pregnancy, antepartum   Preterm labor symptoms and general obstetric precautions including but not limited to vaginal bleeding, contractions, leaking of fluid and fetal movement were reviewed in detail with the patient. I discussed the assessment and treatment plan with the patient. The patient was provided an opportunity to ask questions and all were answered. The patient agreed with the plan and demonstrated an understanding of the instructions. The patient was advised to call back or seek an in-person office evaluation/go to [facility]U at [facility] for any urgent or concerning symptoms. Please refer to After Visit Summary for other counseling recommendations.   I provided 10 minutes of face-to-face time during this encounter.  Return in about 4 weeks (around 12/20/2019) for MyChart.  Future Appointments  Date Time Provider Department Center  11/29/2019 12:45 PM [facility]FC [place] 5 WH-MFCUS MFC-US    Coral Ceo, MD Center for [facility] 11/22/2019

## 2019-11-22 NOTE — Progress Notes (Signed)
Pt has been numbness in her legs and feet x 1 week, makes it hard to sleep. Pt has also been having some dizziness.

## 2019-11-24 ENCOUNTER — Other Ambulatory Visit: Payer: Self-pay

## 2019-11-24 ENCOUNTER — Emergency Department (HOSPITAL_COMMUNITY)
Admission: EM | Admit: 2019-11-24 | Discharge: 2019-11-24 | Disposition: A | Discharge status: Home / Self Care | Payer: Medicaid Other | Attending: Emergency Medicine | Admitting: Emergency Medicine

## 2019-11-24 ENCOUNTER — Encounter (HOSPITAL_COMMUNITY): Payer: Self-pay | Admitting: Emergency Medicine

## 2019-11-24 DIAGNOSIS — R42 Dizziness and giddiness: Secondary | ICD-10-CM

## 2019-11-24 DIAGNOSIS — Z3A23 23 weeks gestation of pregnancy: Secondary | ICD-10-CM | POA: Insufficient documentation

## 2019-11-24 DIAGNOSIS — Z87891 Personal history of nicotine dependence: Secondary | ICD-10-CM | POA: Diagnosis not present

## 2019-11-24 DIAGNOSIS — R5383 Other fatigue: Secondary | ICD-10-CM | POA: Diagnosis not present

## 2019-11-24 DIAGNOSIS — D649 Anemia, unspecified: Secondary | ICD-10-CM

## 2019-11-24 DIAGNOSIS — O99012 Anemia complicating pregnancy, second trimester: Secondary | ICD-10-CM | POA: Diagnosis not present

## 2019-11-24 DIAGNOSIS — O99891 Other specified diseases and conditions complicating pregnancy: Secondary | ICD-10-CM | POA: Diagnosis present

## 2019-11-24 LAB — CBC
HCT: 31.1 % — ABNORMAL LOW (ref 36.0–46.0)
Hemoglobin: 9.6 g/dL — ABNORMAL LOW (ref 12.0–15.0)
MCH: 25.1 pg — ABNORMAL LOW (ref 26.0–34.0)
MCHC: 30.9 g/dL (ref 30.0–36.0)
MCV: 81.4 fL (ref 80.0–100.0)
Platelets: 220 10*3/uL (ref 150–400)
RBC: 3.82 MIL/uL — ABNORMAL LOW (ref 3.87–5.11)
RDW: 13.9 % (ref 11.5–15.5)
WBC: 8.6 10*3/uL (ref 4.0–10.5)
nRBC: 0 % (ref 0.0–0.2)

## 2019-11-24 LAB — BASIC METABOLIC PANEL
Anion gap: 9 (ref 5–15)
BUN: 9 mg/dL (ref 6–20)
CO2: 24 mmol/L (ref 22–32)
Calcium: 8.8 mg/dL — ABNORMAL LOW (ref 8.9–10.3)
Chloride: 104 mmol/L (ref 98–111)
Creatinine, Ser: 0.47 mg/dL (ref 0.44–1.00)
GFR calc Af Amer: >60 mL/min (ref >60)
GFR calc non Af Amer: >60 mL/min (ref >60)
Glucose, Bld: 93 mg/dL (ref 70–99)
Potassium: 3.8 mmol/L (ref 3.5–5.1)
Sodium: 137 mmol/L (ref 135–145)

## 2019-11-24 LAB — URINALYSIS, ROUTINE W REFLEX MICROSCOPIC
Bilirubin Urine: NEGATIVE
Glucose, UA: NEGATIVE mg/dL
Hgb urine dipstick: NEGATIVE
Ketones, ur: NEGATIVE mg/dL
Nitrite: NEGATIVE
Protein, ur: 30 mg/dL — AB
Specific Gravity, Urine: 1.025 (ref 1.005–1.030)
pH: 8 (ref 5.0–8.0)

## 2019-11-24 LAB — TROPONIN I (HIGH SENSITIVITY): Troponin I (High Sensitivity): <2 ng/L (ref <18)

## 2019-11-24 MED ORDER — FERROUS SULFATE 325 (65 FE) MG PO TABS
325.0000 mg | ORAL_TABLET | Freq: Once | ORAL | Status: AC
  Administered 2019-11-24: 325 mg via ORAL
  Filled 2019-11-24: qty 1

## 2019-11-24 MED ORDER — FERROUS SULFATE 325 (65 FE) MG PO TABS: 325.0000 mg | ORAL_TABLET | Freq: Two times a day (BID) | ORAL | 0 refills | Status: AC

## 2019-11-24 MED ORDER — SODIUM CHLORIDE 0.9% FLUSH: 3.0000 mL | Freq: Once | INTRAVENOUS | Status: DC

## 2019-11-24 MED ORDER — SODIUM CHLORIDE 0.9 % IV BOLUS (SEPSIS)
1000.0000 mL | Freq: Once | INTRAVENOUS | Status: AC
  Administered 2019-11-24: 12:00:00 1000 mL via INTRAVENOUS

## 2019-11-24 NOTE — Discharge Instructions (Addendum)
You were seen today for feeling lightheaded and unwell. It appears you have low hemoglobin which is called anemia. We are starting you on iron pills to help with this. Stay hydrated with lots of water because this medication can cause constipation. You will need to follow up with your obgyn to recheck your hemoglobin levels. Stand up slowly to avoid symptoms. Thank you for allowing me to care for you today. Please return to the emergency department if you have new or worsening symptoms. Take your medications as instructed.

## 2019-11-24 NOTE — ED Provider Notes (Signed)
[facility] EMERGENCY DEPARTMENT Provider Note   CSN: [id number] Arrival date & time: 11/24/19  1101     History Chief Complaint  Patient presents with  . Dizziness  . Shortness of Breath  . Fast HR    Hannah Bradford is a 24 y.o. female.  24 y/o G2P1 patient currently [redacted] weeks pregnant presenting to the ER for multiple symptoms. She reports over the last 1.5 weeks she has felt dizzy, lightheaded, SOB with palpitations when she stands up. Denies syncope. Also reports that when she tried to sleep at night she feels like her legs are tingling and restless and has a hard time sleeping. Denies cough, fever, chest pain, n/v. Reports some diarrhea most morning. Is eating and drinking well.         Past Medical History:  Diagnosis Date  . Anxiety   . Medical history non-contributory     Patient Active Problem List   Diagnosis Date Noted  . Anxiety during pregnancy in first trimester, antepartum 09/04/2019  . Encounter for supervision of normal pregnancy, unspecified, unspecified trimester 08/02/2019  . Postpartum care and examination 10/18/2018  . 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 Former smoker 03/03/2018    Past Surgical History:  Procedure Laterality Date  . NO PAST SURGERIES       OB History    Gravida  2   Para  1   Term  1   Preterm      AB      Living  1     SAB      TAB      Ectopic      Multiple  0   Live Births  1           Family History  Problem Relation Age of Onset  . Hypertension Maternal Grandmother   . Hypertension Paternal Grandmother   . Diabetes Paternal Grandmother     Social History   Tobacco Use  . Smoking status: Former Smoker    Types: Cigarettes    Quit date: 01/11/2018    Years since quitting: 1.8  . Smokeless tobacco: Never Used  Substance Use Topics  . Alcohol use: Never  . Drug use: Not Currently    Types: Marijuana    Comment: last use Oct  2019    Home Medications Prior to Admission medications   Medication Sig Start Date End Date Taking? Authorizing Provider  busPIRone (BUSPAR) 5 MG tablet Take 1 tablet (5 mg total) by mouth 3 (three) times daily. Patient not taking: Reported on 10/17/2019 09/04/19   Sharen Counter A, CNM  cyclobenzaprine (FLEXERIL) 10 MG tablet Take 1 tablet (10 mg total) by mouth 3 (three) times daily as needed for muscle spasms. 10/17/19   Bennie Pierini, FNP  ferrous sulfate 325 (65 FE) MG tablet Take 1 tablet (325 mg total) by mouth 2 (two) times daily with a meal. 11/24/19 12/24/19  Ronnie Doss A, PA-C  metoCLOPramide (REGLAN) 10 MG tablet Take 1 tablet (10 mg total) by mouth 3 (three) times daily with meals as needed for nausea. Patient not taking: Reported on 10/17/2019 09/04/19   Sharen Counter A, CNM  naproxen (NAPROSYN) 500 MG tablet Take 1 tablet (500 mg total) by mouth 2 (two) times daily with a meal. 10/17/19   Bennie Pierini, FNP  Prenatal Vit-Fe Fumarate-FA (PRENATAL VITAMINS PO) Take 1 tablet by mouth daily with breakfast.     [provider]  Allergies    Patient has no known allergies.  Review of Systems   Review of Systems  Constitutional: Positive for fatigue. Negative for appetite change, chills, diaphoresis, fever and unexpected weight change.  HENT: Negative for congestion, sinus pain and sore throat.   Eyes: Negative for pain.  Respiratory: Positive for shortness of breath. Negative for cough, chest tightness and wheezing.   Cardiovascular: Positive for palpitations. Negative for chest pain and leg swelling.  Gastrointestinal: Positive for diarrhea. Negative for abdominal pain, constipation, nausea and vomiting.  Endocrine: Negative for polyuria.  Genitourinary: Negative for dysuria.  Musculoskeletal: Positive for arthralgias and myalgias. Negative for back pain, neck pain and neck stiffness.  Skin: Negative for rash.  Neurological: Negative for  dizziness, speech difficulty, light-headedness and headaches.  All other systems reviewed and are negative.   Physical Exam Updated Vital Signs BP 110/65   Pulse 86   Temp 98.7 F (37.1 C) (Oral)   Resp 17   LMP 06/17/2019   SpO2 100%   Physical Exam Vitals and nursing note reviewed.  Constitutional:      General: She is not in acute distress.    Appearance: Normal appearance. She is well-developed. She is not ill-appearing, toxic-appearing or diaphoretic.  HENT:     Head: Normocephalic.  Eyes:     Conjunctiva/sclera: Conjunctivae normal.     Pupils: Pupils are equal, round, and reactive to light.  Cardiovascular:     Rate and Rhythm: Normal rate and regular rhythm.  Pulmonary:     Effort: Pulmonary effort is normal.     Breath sounds: Normal breath sounds.  Chest:     Chest wall: No mass or tenderness.  Abdominal:     Palpations: Abdomen is soft.     Tenderness: There is no abdominal tenderness. There is no guarding.  Musculoskeletal:     Right lower leg: No edema.     Left lower leg: No edema.  Skin:    General: Skin is dry.  Neurological:     Mental Status: She is alert.  Psychiatric:        Mood and Affect: Mood normal.     ED Results / Procedures / Treatments   Labs (all labs ordered are listed, but only abnormal results are displayed) Labs Reviewed  BASIC METABOLIC PANEL - Abnormal; Notable for the following components:      Result Value   Calcium 8.8 (*)    All other components within normal limits  CBC - Abnormal; Notable for the following components:   RBC 3.82 (*)    Hemoglobin 9.6 (*)    HCT 31.1 (*)    MCH 25.1 (*)    All other components within normal limits  URINALYSIS, ROUTINE W REFLEX MICROSCOPIC - Abnormal; Notable for the following components:   APPearance HAZY (*)    Protein, ur 30 (*)    Leukocytes,Ua TRACE (*)    Bacteria, UA RARE (*)    All other components within normal limits  URINE CULTURE  TROPONIN I (HIGH SENSITIVITY)     EKG EKG Interpretation  Date/Time:  Saturday November 24 2019 11:13:15 EST Ventricular Rate:  109 PR Interval:  172 QRS Duration: 80 QT Interval:  318 QTC Calculation: 428 R Axis:   55 Text Interpretation: Sinus tachycardia Possible Left atrial enlargement Borderline ECG No significant change since last tracing Confirmed by Frederick Peers [phone]) on 11/24/2019 12:42:02 PM   Radiology No results found.  Procedures Procedures (including critical care time)  Medications Ordered  in ED Medications  sodium chloride flush (NS) 0.9 % injection 3 mL (3 mLs Intravenous Not Given 11/24/19 1218)  sodium chloride 0.9 % bolus 1,000 mL (1,000 mLs Intravenous New Bag/Given 11/24/19 1217)  ferrous sulfate tablet 325 mg (325 mg Oral Given 11/24/19 1323)    ED Course  I have reviewed the triage vital signs and the nursing notes.  Pertinent labs & imaging results that were available during my care of the patient were reviewed by me and considered in my medical decision making (see chart for details).  Clinical Course as of Nov 23 1333  Sat Nov 24, 2019  1247 [redacted] week pregnant patient presenting with lightheadedness, palpitations with standing for 1.5 weeks. Mildly tachycardic on exam but otherwise well appearing. Workup revealing hemoglobin of 9.6, 2 months ago she had normal hemoglobin. Otherwise reassuring workup. I discussed this with MAU provider who is recommending starting PO iron supplement. Her tachycardia resolved with fluid bolus. Urinalysis discussed with MAU as well. Will culture this and ob will follow up   [KM]    Clinical Course User Index [KM] Kristine Royal   MDM Rules/Calculators/A&P                      Based on review of vitals, medical screening exam, lab work and/or imaging, there does not appear to be an acute, emergent etiology for the patient's symptoms. Counseled pt on good return precautions and encouraged both PCP and ED follow-up as needed.  Prior to  discharge, I also discussed incidental imaging findings with patient in detail and advised appropriate, recommended follow-up in detail.  Clinical Impression: 1. Symptomatic anemia   2. Lightheadedness     Disposition: Discharge  Prior to providing a prescription for a controlled substance, I independently reviewed the patient's recent prescription history on the [place]. The patient had no recent or regular prescriptions and was deemed appropriate for a brief, less than 3 day prescription of narcotic for acute analgesia.  This note was prepared with assistance of Systems analyst. Occasional wrong-word or sound-a-like substitutions may have occurred due to the inherent limitations of voice recognition software.  Final Clinical Impression(s) / ED Diagnoses Final diagnoses:  Symptomatic anemia  Lightheadedness    Rx / DC Orders ED Discharge Orders         Ordered    ferrous sulfate 325 (65 FE) MG tablet  2 times daily with meals     11/24/19 1334           Kristine Royal 11/24/19 1335    Little, Wenda Overland, MD 11/25/19 [phone]

## 2019-11-24 NOTE — ED Triage Notes (Signed)
Pt [redacted] weeks pregnant- c/o bilateral leg numbness/tingling, dizziness, SOB, heart beating fast, feeling hot, and pressure to bilateral shoulders x 1 week.  States HR 117-140s today.

## 2019-11-24 NOTE — ED Notes (Signed)
Patient verbalizes understanding of discharge instructions. Opportunity for questioning and answers were provided. Pt discharged from ED. 

## 2019-11-25 LAB — URINE CULTURE

## 2019-11-29 ENCOUNTER — Other Ambulatory Visit: Payer: Self-pay

## 2019-11-29 ENCOUNTER — Other Ambulatory Visit (HOSPITAL_COMMUNITY): Payer: Self-pay | Admitting: *Deleted

## 2019-11-29 ENCOUNTER — Ambulatory Visit (HOSPITAL_COMMUNITY)
Admission: RE | Admit: 2019-11-29 | Discharge: 2019-11-29 | Disposition: A | Discharge status: Home / Self Care | Payer: Medicaid Other | Source: Ambulatory Visit | Attending: Obstetrics and Gynecology | Admitting: Obstetrics and Gynecology

## 2019-11-29 DIAGNOSIS — Z3A19 19 weeks gestation of pregnancy: Secondary | ICD-10-CM | POA: Diagnosis not present

## 2019-11-29 DIAGNOSIS — Z3480 Encounter for supervision of other normal pregnancy, unspecified trimester: Secondary | ICD-10-CM | POA: Insufficient documentation

## 2019-11-29 DIAGNOSIS — O321XX0 Maternal care for breech presentation, not applicable or unspecified: Secondary | ICD-10-CM

## 2019-11-29 DIAGNOSIS — Z3A23 23 weeks gestation of pregnancy: Secondary | ICD-10-CM

## 2019-11-29 DIAGNOSIS — Z363 Encounter for antenatal screening for malformations: Secondary | ICD-10-CM | POA: Diagnosis not present

## 2019-11-29 DIAGNOSIS — Z362 Encounter for other antenatal screening follow-up: Secondary | ICD-10-CM

## 2019-11-30 ENCOUNTER — Encounter: Payer: Self-pay | Admitting: Advanced Practice Midwife

## 2019-11-30 DIAGNOSIS — O132 Gestational [pregnancy-induced] hypertension without significant proteinuria, second trimester: Secondary | ICD-10-CM | POA: Insufficient documentation

## 2019-12-03 ENCOUNTER — Ambulatory Visit: Payer: Medicaid Other | Admitting: *Deleted

## 2019-12-03 ENCOUNTER — Other Ambulatory Visit: Payer: Self-pay

## 2019-12-03 VITALS — BP 110/71 | HR 105

## 2019-12-03 DIAGNOSIS — Z3480 Encounter for supervision of other normal pregnancy, unspecified trimester: Secondary | ICD-10-CM

## 2019-12-03 NOTE — Progress Notes (Signed)
Subjective:  Hannah Bradford is a 24 y.o. female here for BP check.   Hypertension ROS: taking medications as instructed, no medication side effects noted, no TIA's, no chest pain on exertion, no dyspnea on exertion, no swelling of ankles and and complains of occasional increase heart rate. Pt states some HA's.   Objective:  BP 110/71   Pulse (!) 105   LMP 06/17/2019   Appearance sounds alert, well appearing, and in no distress.. General exam BP noted to be well controlled today via Televisit..    Assessment:   Blood Pressure well controlled.   Plan:  Current treatment plan is effective, no change in therapy.  Continue Iron supplement and PNV. Follow up for next OB visit.

## 2019-12-04 NOTE — Progress Notes (Signed)
Patient seen and assessed by nursing staff during this encounter. I have reviewed the chart and agree with the documentation and plan.  Irineo Gaulin, MD 12/04/2019 8:22 AM    

## 2019-12-19 ENCOUNTER — Other Ambulatory Visit: Payer: Self-pay

## 2019-12-19 ENCOUNTER — Encounter: Payer: Self-pay | Admitting: Obstetrics

## 2019-12-19 ENCOUNTER — Inpatient Hospital Stay (HOSPITAL_COMMUNITY)
Admission: AD | Admit: 2019-12-19 | Discharge: 2019-12-19 | Disposition: A | Discharge status: Home / Self Care | Payer: Medicaid Other | Attending: Family Medicine | Admitting: Family Medicine

## 2019-12-19 ENCOUNTER — Encounter (HOSPITAL_COMMUNITY): Payer: Self-pay | Admitting: Family Medicine

## 2019-12-19 ENCOUNTER — Telehealth (INDEPENDENT_AMBULATORY_CARE_PROVIDER_SITE_OTHER): Payer: Medicaid Other | Admitting: Obstetrics

## 2019-12-19 VITALS — BP 140/78 | HR 100

## 2019-12-19 DIAGNOSIS — Z3689 Encounter for other specified antenatal screening: Secondary | ICD-10-CM

## 2019-12-19 DIAGNOSIS — Z3A26 26 weeks gestation of pregnancy: Secondary | ICD-10-CM | POA: Diagnosis not present

## 2019-12-19 DIAGNOSIS — Z79899 Other long term (current) drug therapy: Secondary | ICD-10-CM | POA: Diagnosis not present

## 2019-12-19 DIAGNOSIS — Z8249 Family history of ischemic heart disease and other diseases of the circulatory system: Secondary | ICD-10-CM | POA: Insufficient documentation

## 2019-12-19 DIAGNOSIS — O99282 Endocrine, nutritional and metabolic diseases complicating pregnancy, second trimester: Secondary | ICD-10-CM | POA: Diagnosis not present

## 2019-12-19 DIAGNOSIS — R519 Headache, unspecified: Secondary | ICD-10-CM

## 2019-12-19 DIAGNOSIS — Z833 Family history of diabetes mellitus: Secondary | ICD-10-CM | POA: Diagnosis not present

## 2019-12-19 DIAGNOSIS — R030 Elevated blood-pressure reading, without diagnosis of hypertension: Secondary | ICD-10-CM

## 2019-12-19 DIAGNOSIS — I10 Essential (primary) hypertension: Secondary | ICD-10-CM | POA: Diagnosis present

## 2019-12-19 DIAGNOSIS — M549 Dorsalgia, unspecified: Secondary | ICD-10-CM

## 2019-12-19 DIAGNOSIS — O26892 Other specified pregnancy related conditions, second trimester: Secondary | ICD-10-CM | POA: Diagnosis not present

## 2019-12-19 DIAGNOSIS — R42 Dizziness and giddiness: Secondary | ICD-10-CM | POA: Diagnosis not present

## 2019-12-19 DIAGNOSIS — E860 Dehydration: Secondary | ICD-10-CM | POA: Insufficient documentation

## 2019-12-19 DIAGNOSIS — O163 Unspecified maternal hypertension, third trimester: Secondary | ICD-10-CM

## 2019-12-19 DIAGNOSIS — Z87891 Personal history of nicotine dependence: Secondary | ICD-10-CM | POA: Diagnosis not present

## 2019-12-19 DIAGNOSIS — Z3480 Encounter for supervision of other normal pregnancy, unspecified trimester: Secondary | ICD-10-CM

## 2019-12-19 DIAGNOSIS — O26899 Other specified pregnancy related conditions, unspecified trimester: Secondary | ICD-10-CM

## 2019-12-19 DIAGNOSIS — O99343 Other mental disorders complicating pregnancy, third trimester: Secondary | ICD-10-CM

## 2019-12-19 LAB — URINALYSIS, ROUTINE W REFLEX MICROSCOPIC
Bilirubin Urine: NEGATIVE
Glucose, UA: NEGATIVE mg/dL
Hgb urine dipstick: NEGATIVE
Ketones, ur: NEGATIVE mg/dL
Nitrite: NEGATIVE
Protein, ur: 30 mg/dL — AB
Specific Gravity, Urine: 1.031 — ABNORMAL HIGH (ref 1.005–1.030)
Squamous Epithelial / HPF: >50 — ABNORMAL HIGH (ref 0–5)
pH: 5 (ref 5.0–8.0)

## 2019-12-19 LAB — CBC WITH DIFFERENTIAL/PLATELET
Abs Immature Granulocytes: 0.05 10*3/uL (ref 0.00–0.07)
Basophils Absolute: 0 10*3/uL (ref 0.0–0.1)
Basophils Relative: 0 %
Eosinophils Absolute: 0.1 10*3/uL (ref 0.0–0.5)
Eosinophils Relative: 1 %
HCT: 30.6 % — ABNORMAL LOW (ref 36.0–46.0)
Hemoglobin: 9.5 g/dL — ABNORMAL LOW (ref 12.0–15.0)
Immature Granulocytes: 1 %
Lymphocytes Relative: 17 %
Lymphs Abs: 1.8 10*3/uL (ref 0.7–4.0)
MCH: 25.5 pg — ABNORMAL LOW (ref 26.0–34.0)
MCHC: 31 g/dL (ref 30.0–36.0)
MCV: 82 fL (ref 80.0–100.0)
Monocytes Absolute: 0.8 10*3/uL (ref 0.1–1.0)
Monocytes Relative: 7 %
Neutro Abs: 8 10*3/uL — ABNORMAL HIGH (ref 1.7–7.7)
Neutrophils Relative %: 74 %
Platelets: 188 10*3/uL (ref 150–400)
RBC: 3.73 MIL/uL — ABNORMAL LOW (ref 3.87–5.11)
RDW: 14.2 % (ref 11.5–15.5)
WBC: 10.7 10*3/uL — ABNORMAL HIGH (ref 4.0–10.5)
nRBC: 0 % (ref 0.0–0.2)

## 2019-12-19 LAB — COMPREHENSIVE METABOLIC PANEL
ALT: 10 U/L (ref 0–44)
AST: 13 U/L — ABNORMAL LOW (ref 15–41)
Albumin: 2.6 g/dL — ABNORMAL LOW (ref 3.5–5.0)
Alkaline Phosphatase: 69 U/L (ref 38–126)
Anion gap: 8 (ref 5–15)
BUN: 8 mg/dL (ref 6–20)
CO2: 24 mmol/L (ref 22–32)
Calcium: 8.7 mg/dL — ABNORMAL LOW (ref 8.9–10.3)
Chloride: 106 mmol/L (ref 98–111)
Creatinine, Ser: 0.45 mg/dL (ref 0.44–1.00)
GFR calc Af Amer: >60 mL/min (ref >60)
GFR calc non Af Amer: >60 mL/min (ref >60)
Glucose, Bld: 85 mg/dL (ref 70–99)
Potassium: 3.8 mmol/L (ref 3.5–5.1)
Sodium: 138 mmol/L (ref 135–145)
Total Bilirubin: 0.4 mg/dL (ref 0.3–1.2)
Total Protein: 5.9 g/dL — ABNORMAL LOW (ref 6.5–8.1)

## 2019-12-19 LAB — PROTEIN / CREATININE RATIO, URINE
Creatinine, Urine: 238.75 mg/dL
Protein Creatinine Ratio: 0.08 mg/mg{Cre} (ref 0.00–0.15)
Total Protein, Urine: 20 mg/dL

## 2019-12-19 MED ORDER — COMFORT FIT MATERNITY SUPP SM MISC: 0 refills | Status: AC

## 2019-12-19 NOTE — Progress Notes (Addendum)
OBSTETRICS PRENATAL VIRTUAL VISIT ENCOUNTER NOTE  Provider location: [facility] for [facility]   I connected with Hannah Bradford on 12/19/19 at 11:00 AM EDT by MyChart Video Encounter at home and verified that I am speaking with the correct person using two identifiers.   I discussed the limitations, risks, security and privacy concerns of performing an evaluation and management service virtually and the availability of in person appointments. I also discussed with the patient that there may be a patient responsible charge related to this service. The patient expressed understanding and agreed to proceed.  Subjective:  Hannah Bradford is a 24 y.o. G2P1001 at [redacted]w[redacted]d being seen today for ongoing prenatal care.  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  Patient reports backache.  Contractions: Not present. Vag. Bleeding: None.  Movement: Present. Denies any leaking of fluid.   The following portions of the patient's history were reviewed and updated as appropriate: allergies, current medications, past family history, past medical history, past social history, past surgical history and problem list.   Objective:   Vitals:   12/19/19 1125  BP: 140/78  Pulse: 100    Fetal Status:     Movement: Present     General:  Alert, oriented and cooperative. Patient is in no acute distress.  Respiratory: Normal respiratory effort, no problems with respiration noted  Mental Status: Normal mood and affect. Normal behavior. Normal judgment and thought content.  Rest of physical exam deferred due to type of  encounter  Imaging: [place] MFM OB COMP + 14 WK  Result Date: 11/29/2019 ----------------------------------------------------------------------  OBSTETRICS REPORT                       (Signed Final 11/29/2019 01:29 pm) ---------------------------------------------------------------------- Patient Info  ID #:       [id number]                          D.O.B.:  [birth year] (24 yrs)  Name:       Hannah Bradford                Visit Date: 11/29/2019 12:33 pm ---------------------------------------------------------------------- Performed By  Performed By:     Percell Boston          Secondary Phy.:   [facility]                    RDMS  Attending:        Ma Rings MD         Address:          [address]  [address]                                                             [place]                                                             [address]  Referred By:      Melene Plan              [facility] ---------------------------------------------------------------------- Orders   #  Description                          Code         Ordered By   1  [place] MFM OB COMP + 14 WK               76805.01     LISA LEFTWICH-                                                        KIRBY  ----------------------------------------------------------------------   #  Order #                    Accession #                 Episode #   1  [id number]                  [id number]                  [id number]  ---------------------------------------------------------------------- Indications   Encounter for antenatal screening for          Z36.3   malformations   [redacted] weeks gestation of pregnancy                Z3A.23   Low risk NIPS, 8.8FF, negative HORIZON  ----------------------------------------------------------------------  Fetal Evaluation  Num Of Fetuses:         1  Fetal Heart Rate(bpm):  136  Cardiac Activity:       Observed  Presentation:           Breech  Placenta:               Posterior  P. Cord Insertion:      Visualized, central  Amniotic Fluid  AFI FV:      Within normal limits  Largest Pocket(cm)                              6.12 ---------------------------------------------------------------------- Biometry  BPD:      58.7  mm     G. Age:  24w 0d         61  %    CI:        74.18   %    70 - 86                                                          FL/HC:      19.3   %    18.7 - 20.9  HC:      216.4  mm     G. Age:  23w 5d         38  %    HC/AC:      1.11        1.05 - 1.21  AC:      195.2  mm     G. Age:  24w 1d         62  %    FL/BPD:     71.2   %    71 - 87  FL:       41.8  mm     G. Age:  23w 4d         39  %    FL/AC:      21.4   %    20 - 24  HUM:      39.2  mm     G. Age:  24w 0d         51  %  CER:      26.1  mm     G. Age:  24w 0d         57  %  CM:        8.6  mm  Est. FW:     643  gm      1 lb 7 oz     59  % ---------------------------------------------------------------------- OB History  Gravidity:    2         Term:   1        Prem:   0        SAB:   0  TOP:          0       Ectopic:  0        Living: 1 ---------------------------------------------------------------------- Gestational Age  LMP:           23w 4d        Date:  06/17/19                 EDD:   03/23/20  U/S Today:     23w 6d                                        EDD:   03/21/20  Best:          23w 4d     Det. By:  LMP  (06/17/19)  EDD:   03/23/20 ---------------------------------------------------------------------- Anatomy  Cranium:               Appears normal         LVOT:                   Not well visualized  Cavum:                 Appears normal         Aortic Arch:            Appears normal  Ventricles:            Appears normal         Ductal Arch:            Not well visualized  Choroid Plexus:         Appears normal         Diaphragm:              Appears normal  Cerebellum:            Appears normal         Stomach:                Appears normal, left                                                                        sided  Posterior Fossa:       Appears normal         Abdomen:                Appears normal  Nuchal Fold:           Not applicable (>20    Abdominal Wall:         Appears nml (cord                         wks GA)                                        insert, abd wall)  Face:                  Appears normal         Cord Vessels:           Appears normal (3                         (orbits and profile)                           vessel cord)  Lips:                  Appears normal         Kidneys:                Appear normal  Palate:                Not well visualized    Bladder:  Appears normal  Thoracic:              Appears normal         Spine:                  Appears normal  Heart:                 Appears normal         Upper Extremities:      Appears normal                         (4CH, axis, and                         situs)  RVOT:                  Not well visualized    Lower Extremities:      Appears normal  Other:  Heels and 5th digit visualized. Nasal bone visualized. SVC/IVC          visualized. Technically difficult due to fetal position. ---------------------------------------------------------------------- Cervix Uterus Adnexa  Cervix  Length:            4.8  cm.  Normal appearance by transabdominal scan.  Uterus  No abnormality visualized.  Left Ovary  No adnexal mass visualized.  Right Ovary  No adnexal mass visualized.  Cul De Sac  No free fluid seen.  Adnexa  No abnormality visualized. ---------------------------------------------------------------------- Comments  This patient was seen for a detailed fetal anatomy scan.  The  patient reports that she possibly may have elevated blood  pressures.  She notes that her blood pressures at home were  in the 140s  over 80s to 90s range this morning. She denies  any other significant past medical history and denies any  problems in her current pregnancy.  She had a cell free DNA test earlier in her pregnancy which  indicated a low risk for trisomy 43, 54, and 13. A female fetus is  predicted.  She was informed that the fetal growth and amniotic fluid  level were appropriate for her gestational age.  There were no obvious fetal anomalies noted on today's  ultrasound exam.  However, today's exam was limited due to  the fetal position.  The patient was informed that anomalies may be missed due  to technical limitations. If the fetus is in a suboptimal position  or maternal habitus is increased, visualization of the fetus in  the maternal uterus may be impaired.  Should her blood pressures remain elevated, we will continue  to follow her with serial growth ultrasounds.  A follow-up exam was scheduled in 4 weeks. ----------------------------------------------------------------------                   Ma Rings, MD Electronically Signed Final Report   11/29/2019 01:29 pm ----------------------------------------------------------------------   Assessment and Plan:  Pregnancy: G2P1001 at [redacted]w[redacted]d  1. Supervision of other normal pregnancy, antepartum  2. Elevated BP without diagnosis of hypertension - patient sent to hospital for further evaluation  3. Headache in pregnancy, antepartum - sent to hospital  4. Backache symptom Rx: - Elastic Bandages & Supports (COMFORT FIT MATERNITY SUPP SM) MISC; Wear as directed.  Dispense: 1 each; Refill: 0   Preterm labor symptoms and general obstetric precautions including but not limited to vaginal bleeding, contractions, leaking of fluid and fetal movement were reviewed in detail  with the patient. I discussed the assessment and treatment plan with the patient. The patient was provided an opportunity to ask questions and all were answered. The patient agreed with the plan and  demonstrated an understanding of the instructions. The patient was advised to call back or seek an in-person office evaluation/go to MAU at [facility] for any urgent or concerning symptoms. Please refer to After Visit Summary for other counseling recommendations.   I provided 10 minutes of face-to-face time during this encounter.  Return in about 2 weeks (around 01/02/2020) for ROB, 2 hour OGTT.  Follow up BP.  Future Appointments  Date Time Provider [place]  12/26/2019  1:00 PM [place] M[facility]-US  12/26/2019  1:00 PM [facility]-MFC [place] 3 WH-MFCUS MFC-US    Victorio Creeden, [place] for [facility] [place] [facility] 12/19/2019

## 2019-12-19 NOTE — MAU Provider Note (Addendum)
History     CSN: [id number]  Arrival date and time: 12/19/19 1826   None     Chief Complaint  Patient presents with  . Hypertension   HPI   [name] is a 24 y.o. female G2P1001 @ [redacted]w[redacted]d here in MAU with complaints of elevated BP. States she was doing a virtual visit today with Dr. Jodi Bradford and had an elevated BP using her home cuff. States the systolic reading was 818. States she has had elevated BP's at home only and none in the office. Shortly after her visit she checked her BP again and it was 299'B systolic. States she reported to MFM that her home BP's were high however MFM did not report that her BP's were high. No history of high BP, or chronic BP with previous pregnancy. For a month she feels dizziness at times, states the increased activity makes her feel like she feels like she is going to pass out.  Hx of Anemia; taking iron pills BID.  States she was told to come into MAU for labs per Dr. Jodi Bradford.   OB History    Gravida  2   Para  1   Term  1   Preterm      AB      Living  1     SAB      TAB      Ectopic      Multiple  0   Live Births  1           Past Medical History:  Diagnosis Date  . Anxiety   . Medical history non-contributory     Past Surgical History:  Procedure Laterality Date  . NO PAST SURGERIES      Family History  Problem Relation Age of Onset  . Hypertension Maternal Grandmother   . Hypertension Paternal Grandmother   . Diabetes Paternal Grandmother     Social History   Tobacco Use  . Smoking status: Former Smoker    Types: Cigarettes    Quit date: 01/11/2018    Years since quitting: 1.9  . Smokeless tobacco: Never Used  Substance Use Topics  . Alcohol use: Never  . Drug use: Not Currently    Types: Marijuana    Comment: last use Oct 2019    Allergies: No Known Allergies  Medications Prior to Admission  Medication Sig Dispense Refill Last Dose  . ferrous sulfate 325 (65 FE) MG tablet Take 1 tablet (325  mg total) by mouth 2 (two) times daily with a meal. 60 tablet 0 12/19/2019 at Unknown time  . Prenatal Vit-Fe Fumarate-FA (PRENATAL VITAMINS PO) Take 1 tablet by mouth daily with breakfast.    12/19/2019 at Unknown time  . busPIRone (BUSPAR) 5 MG tablet Take 1 tablet (5 mg total) by mouth 3 (three) times daily. (Patient not taking: Reported on 10/17/2019) 90 tablet 3   . cyclobenzaprine (FLEXERIL) 10 MG tablet Take 1 tablet (10 mg total) by mouth 3 (three) times daily as needed for muscle spasms. (Patient not taking: Reported on 12/19/2019) 30 tablet 0   . Elastic Bandages & Supports (COMFORT FIT MATERNITY SUPP SM) MISC Wear as directed. 1 each 0   . metoCLOPramide (REGLAN) 10 MG tablet Take 1 tablet (10 mg total) by mouth 3 (three) times daily with meals as needed for nausea. (Patient not taking: Reported on 10/17/2019) 60 tablet 2   . naproxen (NAPROSYN) 500 MG tablet Take 1 tablet (500 mg total) by mouth 2 (  two) times daily with a meal. (Patient not taking: Reported on 12/19/2019) 60 tablet 0    Results for orders placed or performed during the hospital encounter of 12/19/19 (from the past 48 hour(s))  Urinalysis, Routine w reflex microscopic     Status: Abnormal   Collection Time: 12/19/19  7:22 PM  Result Value Ref Range   Color, Urine YELLOW YELLOW   APPearance CLOUDY (A) CLEAR   Specific Gravity, Urine 1.031 (H) 1.005 - 1.030   pH 5.0 5.0 - 8.0   Glucose, UA NEGATIVE NEGATIVE mg/dL   Hgb urine dipstick NEGATIVE NEGATIVE   Bilirubin Urine NEGATIVE NEGATIVE   Ketones, ur NEGATIVE NEGATIVE mg/dL   Protein, ur 30 (A) NEGATIVE mg/dL   Nitrite NEGATIVE NEGATIVE   Leukocytes,Ua MODERATE (A) NEGATIVE   RBC / HPF 0-5 0 - 5 RBC/hpf   WBC, UA 21-50 0 - 5 WBC/hpf   Bacteria, UA MANY (A) NONE SEEN   Squamous Epithelial / LPF >50 (H) 0 - 5   Mucus PRESENT     Comment: Performed at [facility], [address], [place], [place] [address]   Review of Systems  Eyes: Negative for photophobia and visual  disturbance.  Gastrointestinal: Negative for abdominal pain.  Genitourinary: Negative for vaginal bleeding and vaginal discharge.  Neurological: Positive for dizziness (When standing ). Negative for headaches.   Physical Exam   Blood pressure (!) 101/51, pulse (!) 106, temperature 98.2 F (36.8 C), resp. rate 16, height 5\' 3"  (1.6 m), weight 70.3 kg, last menstrual period 06/17/2019, SpO2 100 %, unknown if currently breastfeeding.  Physical Exam  Constitutional: She is oriented to person, place, and time. She appears well-developed and well-nourished. No distress.  HENT:  Head: Normocephalic.  Eyes: Pupils are equal, round, and reactive to light.  GI: Soft. She exhibits no distension. There is no abdominal tenderness. There is no rebound and no guarding.  Musculoskeletal:        General: Normal range of motion.  Neurological: She is alert and oriented to person, place, and time. She has normal reflexes. She displays normal reflexes.  Negative clonus   Skin: Skin is warm. She is not diaphoretic.  Psychiatric: Her behavior is normal.   Fetal Tracing: Baseline: 120 bpm  Variability: Moderate  Accelerations: 15x15 Decelerations: None Toco: None MAU Course  Procedures  None  Results for orders placed or performed during the hospital encounter of 12/19/19 (from the past 24 hour(s))  Urinalysis, Routine w reflex microscopic     Status: Abnormal   Collection Time: 12/19/19  7:22 PM  Result Value Ref Range   Color, Urine YELLOW YELLOW   APPearance CLOUDY (A) CLEAR   Specific Gravity, Urine 1.031 (H) 1.005 - 1.030   pH 5.0 5.0 - 8.0   Glucose, UA NEGATIVE NEGATIVE mg/dL   Hgb urine dipstick NEGATIVE NEGATIVE   Bilirubin Urine NEGATIVE NEGATIVE   Ketones, ur NEGATIVE NEGATIVE mg/dL   Protein, ur 30 (A) NEGATIVE mg/dL   Nitrite NEGATIVE NEGATIVE   Leukocytes,Ua MODERATE (A) NEGATIVE   RBC / HPF 0-5 0 - 5 RBC/hpf   WBC, UA 21-50 0 - 5 WBC/hpf   Bacteria, UA MANY (A) NONE SEEN    Squamous Epithelial / LPF >50 (H) 0 - 5   Mucus PRESENT   Protein / creatinine ratio, urine     Status: None   Collection Time: 12/19/19  8:30 PM  Result Value Ref Range   Creatinine, Urine 238.75 mg/dL  Total Protein, Urine 20 mg/dL   Protein Creatinine Ratio 0.08 0.00 - 0.15 mg/mg[Cre]  CBC with Differential/Platelet     Status: Abnormal   Collection Time: 12/19/19  8:47 PM  Result Value Ref Range   WBC 10.7 (H) 4.0 - 10.5 K/uL   RBC 3.73 (L) 3.87 - 5.11 MIL/uL   Hemoglobin 9.5 (L) 12.0 - 15.0 g/dL   HCT 35.5 (L) 73.2 - 20.2 %   MCV 82.0 80.0 - 100.0 fL   MCH 25.5 (L) 26.0 - 34.0 pg   MCHC 31.0 30.0 - 36.0 g/dL   RDW 54.2 70.6 - 23.7 %   Platelets 188 150 - 400 K/uL   nRBC 0.0 0.0 - 0.2 %   Neutrophils Relative % 74 %   Neutro Abs 8.0 (H) 1.7 - 7.7 K/uL   Lymphocytes Relative 17 %   Lymphs Abs 1.8 0.7 - 4.0 K/uL   Monocytes Relative 7 %   Monocytes Absolute 0.8 0.1 - 1.0 K/uL   Eosinophils Relative 1 %   Eosinophils Absolute 0.1 0.0 - 0.5 K/uL   Basophils Relative 0 %   Basophils Absolute 0.0 0.0 - 0.1 K/uL   Immature Granulocytes 1 %   Abs Immature Granulocytes 0.05 0.00 - 0.07 K/uL  Comprehensive metabolic panel     Status: Abnormal   Collection Time: 12/19/19  8:47 PM  Result Value Ref Range   Sodium 138 135 - 145 mmol/L   Potassium 3.8 3.5 - 5.1 mmol/L   Chloride 106 98 - 111 mmol/L   CO2 24 22 - 32 mmol/L   Glucose, Bld 85 70 - 99 mg/dL   BUN 8 6 - 20 mg/dL   Creatinine, Ser 6.28 0.44 - 1.00 mg/dL   Calcium 8.7 (L) 8.9 - 10.3 mg/dL   Total Protein 5.9 (L) 6.5 - 8.1 g/dL   Albumin 2.6 (L) 3.5 - 5.0 g/dL   AST 13 (L) 15 - 41 U/L   ALT 10 0 - 44 U/L   Alkaline Phosphatase 69 38 - 126 U/L   Total Bilirubin 0.4 0.3 - 1.2 mg/dL   GFR calc non Af Amer >60 >60 mL/min   GFR calc Af Amer >60 >60 mL/min   Anion gap 8 5 - 15    MDM  Orthostatic vitals normal PIH labs UA shows mild dehydration.  Report given to Gerrit Heck, CNM who resumes care of the patient.   Rasch, Harolyn Rutherford, NP   Assessment and Plan  Reassessment (9:59 PM)  -Results return within normal limits. -Instructed to continue iron supplement twice daily. -Informed that if iron levels continue to drop then she would require iron infusion. -Instructed to take blood pressure cuff to next appt to calibrate her home cuff. -No questions or concerns. -Encouraged to call or return to MAU if symptoms worsen or with the onset of new symptoms. -Discharged to home in stable condition.  Cherre Robins MSN, CNM Advanced Practice Provider, Center for [employer]

## 2019-12-19 NOTE — MAU Note (Signed)
.   Hannah Bradford is a 24 y.o. at [redacted]w[redacted]d here in MAU reporting: she had a virtual visit with Dr Clearance Coots today and was told to come in for an increase in her blood pressure. Complaints of pelvic pain, states when she was walking her dog today she feels like she pulled something  Onset of complaint: ongoing Pain score: 6 Vitals:   12/19/19 1847  BP: 117/67  Pulse: (!) 120  Resp: 16  Temp: 98.2 F (36.8 C)  SpO2: 100%     FHT:132 Lab orders placed from triage: UA

## 2019-12-19 NOTE — Addendum Note (Signed)
Addended by: Coral Ceo A on: 12/19/2019 11:52 AM   Modules accepted: Orders

## 2019-12-19 NOTE — Discharge Instructions (Signed)
How to Take Your Blood Pressure Blood pressure is a measurement of how strongly your blood is pressing against the walls of your arteries. Arteries are blood vessels that carry blood from your heart throughout your body. Your health care provider takes your blood pressure at each office visit. You can also take your own blood pressure at home with a blood pressure machine. You may need to take your own blood pressure:  To confirm a diagnosis of high blood pressure (hypertension).  To monitor your blood pressure over time.  To make sure your blood pressure medicine is working. Supplies needed: To take your blood pressure, you will need a blood pressure machine. You can buy a blood pressure machine, or blood pressure monitor, at most drugstores or online. There are several types of home blood pressure monitors. When choosing one, consider the following:  Choose a monitor that has an arm cuff.  Choose a cuff that wraps snugly around your upper arm. You should be able to fit only one finger between your arm and the cuff.  Do not choose a monitor that measures your blood pressure from your wrist or finger. Your health care provider can suggest a reliable monitor that will meet your needs. How to prepare To get the most accurate reading, avoid the following for 30 minutes before you check your blood pressure:  Drinking caffeine.  Drinking alcohol.  Eating.  Smoking.  Exercising. Five minutes before you check your blood pressure:  Empty your bladder.  Sit quietly without talking in a dining chair, rather than in a soft couch or armchair. How to take your blood pressure To check your blood pressure, follow the instructions in the manual that came with your blood pressure monitor. If you have a digital blood pressure monitor, the instructions may be as follows: 1. Sit up straight. 2. Place your feet on the floor. Do not cross your ankles or legs. 3. Rest your left arm at the level of  your heart on a table or desk or on the arm of a chair. 4. Pull up your shirt sleeve. 5. Wrap the blood pressure cuff around the upper part of your left arm, 1 inch (2.5 cm) above your elbow. It is best to wrap the cuff around bare skin. 6. Fit the cuff snugly around your arm. You should be able to place only one finger between the cuff and your arm. 7. Position the cord inside the groove of your elbow. 8. Press the power button. 9. Sit quietly while the cuff inflates and deflates. 10. Read the digital reading on the monitor screen and write it down (record it). 11. Wait 2-3 minutes, then repeat the steps, starting at step 1. What does my blood pressure reading mean? A blood pressure reading consists of a higher number over a lower number. Ideally, your blood pressure should be below 120/80. The first ("top") number is called the systolic pressure. It is a measure of the pressure in your arteries as your heart beats. The second ("bottom") number is called the diastolic pressure. It is a measure of the pressure in your arteries as the heart relaxes. Blood pressure is classified into four stages. The following are the stages for adults who do not have a short-term serious illness or a chronic condition. Systolic pressure and diastolic pressure are measured in a unit called mm Hg. Normal  Systolic pressure: below 120.  Diastolic pressure: below 80. Elevated  Systolic pressure: 120-129.  Diastolic pressure: below 80. Hypertension stage   1  Systolic pressure: 130-139.  Diastolic pressure: 80-89. Hypertension stage 2  Systolic pressure: 140 or above.  Diastolic pressure: 90 or above. You can have prehypertension or hypertension even if only the systolic or only the diastolic number in your reading is higher than normal. Follow these instructions at home:  Check your blood pressure as often as recommended by your health care provider.  Take your monitor to the next appointment with your  health care provider to make sure: ? That you are using it correctly. ? That it provides accurate readings.  Be sure you understand what your goal blood pressure numbers are.  Tell your health care provider if you are having any side effects from blood pressure medicine. Contact a health care provider if:  Your blood pressure is consistently high. Get help right away if:  Your systolic blood pressure is higher than 180.  Your diastolic blood pressure is higher than 110. This information is not intended to replace advice given to you by your health care provider. Make sure you discuss any questions you have with your health care provider. Document Revised: 08/12/2017 Document Reviewed: 02/06/2016 Elsevier Patient Education  2020 [employer]  

## 2019-12-22 LAB — CULTURE, OB URINE

## 2019-12-26 ENCOUNTER — Encounter (HOSPITAL_COMMUNITY): Payer: Self-pay

## 2019-12-26 ENCOUNTER — Ambulatory Visit (HOSPITAL_COMMUNITY)
Admission: RE | Admit: 2019-12-26 | Discharge: 2019-12-26 | Disposition: A | Discharge status: Home / Self Care | Payer: Medicaid Other | Source: Ambulatory Visit | Attending: Obstetrics and Gynecology | Admitting: Obstetrics and Gynecology

## 2019-12-26 ENCOUNTER — Other Ambulatory Visit: Payer: Self-pay

## 2019-12-26 ENCOUNTER — Other Ambulatory Visit (HOSPITAL_COMMUNITY): Payer: Self-pay | Admitting: *Deleted

## 2019-12-26 ENCOUNTER — Ambulatory Visit (HOSPITAL_COMMUNITY): Payer: Medicaid Other | Admitting: *Deleted

## 2019-12-26 VITALS — BP 113/69 | HR 92 | Temp 98.2°F

## 2019-12-26 DIAGNOSIS — O99342 Other mental disorders complicating pregnancy, second trimester: Secondary | ICD-10-CM

## 2019-12-26 DIAGNOSIS — F419 Anxiety disorder, unspecified: Secondary | ICD-10-CM

## 2019-12-26 DIAGNOSIS — Z3A27 27 weeks gestation of pregnancy: Secondary | ICD-10-CM

## 2019-12-26 DIAGNOSIS — Z362 Encounter for other antenatal screening follow-up: Secondary | ICD-10-CM

## 2019-12-26 DIAGNOSIS — O162 Unspecified maternal hypertension, second trimester: Secondary | ICD-10-CM | POA: Insufficient documentation

## 2019-12-26 DIAGNOSIS — O99012 Anemia complicating pregnancy, second trimester: Secondary | ICD-10-CM | POA: Diagnosis not present

## 2019-12-26 DIAGNOSIS — D649 Anemia, unspecified: Secondary | ICD-10-CM | POA: Diagnosis not present

## 2019-12-31 ENCOUNTER — Encounter: Payer: Self-pay | Admitting: Obstetrics

## 2019-12-31 ENCOUNTER — Telehealth (INDEPENDENT_AMBULATORY_CARE_PROVIDER_SITE_OTHER): Payer: Medicaid Other | Admitting: Obstetrics

## 2019-12-31 VITALS — BP 124/73 | HR 120

## 2019-12-31 DIAGNOSIS — O99321 Drug use complicating pregnancy, first trimester: Secondary | ICD-10-CM

## 2019-12-31 DIAGNOSIS — F1290 Cannabis use, unspecified, uncomplicated: Secondary | ICD-10-CM

## 2019-12-31 DIAGNOSIS — M549 Dorsalgia, unspecified: Secondary | ICD-10-CM

## 2019-12-31 DIAGNOSIS — Z3490 Encounter for supervision of normal pregnancy, unspecified, unspecified trimester: Secondary | ICD-10-CM

## 2019-12-31 DIAGNOSIS — Z3A28 28 weeks gestation of pregnancy: Secondary | ICD-10-CM

## 2019-12-31 MED ORDER — COMFORT FIT MATERNITY SUPP SM MISC: 0 refills | Status: AC

## 2019-12-31 NOTE — Progress Notes (Signed)
S/w patient for virtual visit, pt reports fetal movement, denies pain 

## 2019-12-31 NOTE — Progress Notes (Signed)
OBSTETRICS PRENATAL VIRTUAL VISIT ENCOUNTER NOTE  Provider location: [facility]   I connected with Hannah Bradford on 12/31/19 at  1:00 PM EDT by MyChart Video Encounter at home and verified that I am speaking with the correct person using two identifiers.   I discussed the limitations, risks, security and privacy concerns of performing an evaluation and management service virtually and the availability of in person appointments. I also discussed with the patient that there may be a patient responsible charge related to this service. The patient expressed understanding and agreed to proceed. Subjective:  Hannah Bradford is a 24 y.o. G2P1001 at [redacted]w[redacted]d being seen today for ongoing prenatal care.  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  Patient reports backache.  Contractions: Not present. Vag. Bleeding: None.  Movement: Present. Denies any leaking of fluid.   The following portions of the patient's history were reviewed and updated as appropriate: allergies, current medications, past family history, past medical history, past social history, past surgical history and problem list.   Objective:   Vitals:   12/31/19 1305  BP: 124/73  Pulse: (!) 120    Fetal Status:     Movement: Present     General:  Alert, oriented and cooperative. Patient is in no acute distress.  Respiratory: Normal respiratory effort, no problems with respiration noted  Mental Status: Normal mood and affect. Normal behavior. Normal judgment and thought content.  Rest of physical exam deferred due to type of encounter  Imaging: [place] MFM OB FOLLOW UP  Result Date:  12/26/2019 ----------------------------------------------------------------------  OBSTETRICS REPORT                       (Signed Final 12/26/2019 02:32 pm) ---------------------------------------------------------------------- Patient Info  ID #:       [id number]                          D.O.B.:  08-20-1996 (24 yrs)  Name:       Hannah Bradford                Visit Date: 12/26/2019 01:00 pm ---------------------------------------------------------------------- Performed By  Performed By:     Tommie Raymond BS,       Ref. Address:     [address]                                                             [address]                                                             [phone]  [place]                                                             [address]  Attending:        Noralee Space MD        [facility]  Referred By:      [facility] ---------------------------------------------------------------------- Orders   #  Description                          Code         Ordered By   1  [place] MFM OB FOLLOW UP                  [phone]     YU FANG  ----------------------------------------------------------------------   #  Order #                    Accession #                 Episode #   1  [id number]                  [id number]                  [id number]  ---------------------------------------------------------------------- Indications   [redacted] weeks gestation of pregnancy                Z3A.27   Hypertension (Transient)   Anemia during pregnancy in second trimester    O99.012   Other mental disorder complicating             O99.340   pregnancy, second trimester (anxiety)   Antenatal follow-up for nonvisualized fetal    Z36.2   anatomy  ---------------------------------------------------------------------- Fetal Evaluation  Num Of Fetuses:          1  Fetal Heart Rate(bpm):  137  Cardiac Activity:       Observed  Presentation:           Breech  Placenta:               Posterior  P. Cord Insertion:      Previously Visualized  Amniotic Fluid  AFI FV:      Within normal limits                              Largest Pocket(cm)                              7.3 ---------------------------------------------------------------------- Biometry  BPD:      69.2  mm     G. Age:  27w 6d         52  %    CI:  74.33   %    70 - 86                                                          FL/HC:      19.9   %    18.6 - 20.4  HC:      254.8  mm     G. Age:  27w 5d         28  %    HC/AC:      1.00        1.05 - 1.21  AC:      255.9  mm     G. Age:  29w 5d         95  %    FL/BPD:     73.1   %    71 - 87  FL:       50.6  mm     G. Age:  27w 1d         27  %    FL/AC:      19.8   %    20 - 24  CER:      31.9  mm     G. Age:  27w 6d         56  %  LV:        4.2  mm  Est. FW:    1248  gm    2 lb 12 oz      82  % ---------------------------------------------------------------------- OB History  Gravidity:    2         Term:   1        Prem:   0        SAB:   0  TOP:          0       Ectopic:  0        Living: 1 ---------------------------------------------------------------------- Gestational Age  LMP:           27w 3d        Date:  06/17/19                 EDD:   03/23/20  U/S Today:     28w 1d                                        EDD:   03/18/20  Best:          27w 3d     Det. By:  LMP  (06/17/19)          EDD:   03/23/20 ---------------------------------------------------------------------- Anatomy  Cranium:               Previously seen        LVOT:                   Previously seen  Cavum:                 Previously seen        Aortic Arch:            Previously seen  Ventricles:  Appears normal         Ductal Arch:            Not well visualized  Choroid Plexus:        Previously seen        Diaphragm:              Appears normal  Cerebellum:             Previously seen        Stomach:                Appears normal, left                                                                        sided  Posterior Fossa:       Previously seen        Abdomen:                Previously seen  Nuchal Fold:           Not applicable (>54    Abdominal Wall:         Appears nml (cord                         wks GA)                                        insert, abd wall)  Face:                  Orbits and profile     Cord Vessels:           Previously seen                         previously seen  Lips:                  Previously seen        Kidneys:                Appear normal  Palate:                Not well visualized    Bladder:                Appears normal  Thoracic:              Previously seen        Spine:                  Previously seen  Heart:                 Appears normal         Upper Extremities:      Previously seen                         (4CH, axis, and                         situs)  RVOT:  Previously seen        Lower Extremities:      Previously seen  Other:  Heels and 5th digit previously seen. . Nasal bone previously seen. .          SVC/IVC visualized prev. ---------------------------------------------------------------------- Cervix Uterus Adnexa  Cervix  Normal appearance by transabdominal scan.  Uterus  No abnormality visualized.  Left Ovary  Within normal limits. No adnexal mass visualized.  Right Ovary  Within normal limits. No adnexal mass visualized.  Cul De Sac  No free fluid seen.  Adnexa  No abnormality visualized. ---------------------------------------------------------------------- Impression  Patient returns for fetal growth assessment and completion of  fetal anatomy.  She reports transient hypertension takes  blood pressure measurements at home.  Blood pressure  today at our office is 113/69 mmHg.  Fetal growth is appropriate for gestational age. Amniotic fluid  is normal and good fetal activity is seen. Fetal anatomical   survey was completed and appears normal.  She will be undergoing screening for gestational diabetes. ---------------------------------------------------------------------- Recommendations  -An appointment was made for her to return in 4 weeks for  fetal growth assessment. ----------------------------------------------------------------------                  Tama High, MD Electronically Signed Final Report   12/26/2019 02:32 pm ----------------------------------------------------------------------   Assessment and Plan:  Pregnancy: G2P1001 at [redacted]w[redacted]d 1. Encounter for supervision of normal pregnancy, antepartum, unspecified gravidity   Preterm labor symptoms and general obstetric precautions including but not limited to vaginal bleeding, contractions, leaking of fluid and fetal movement were reviewed in detail with the patient. I discussed the assessment and treatment plan with the patient. The patient was provided an opportunity to ask questions and all were answered. The patient agreed with the plan and demonstrated an understanding of the instructions. The patient was advised to call back or seek an in-person office evaluation/go to MAU at [facility] for any urgent or concerning symptoms. Please refer to After Visit Summary for other counseling recommendations.   I provided 10 minutes of face-to-face time during this encounter.  Return in about 2 weeks (around 01/14/2020) for MyChart.  Future Appointments  Date Time Provider [place]  01/02/2020  9:00 AM [facility] None  01/02/2020  9:15 AM [place] Mode, MD [place] None  01/24/2020 12:45 PM [place] NURSE [place] [facility]  01/24/2020 12:45 PM [place], [place] for [facility] [place] [facility] 12/31/2019

## 2020-01-02 ENCOUNTER — Other Ambulatory Visit: Payer: Medicaid Other

## 2020-01-02 ENCOUNTER — Encounter: Payer: Medicaid Other | Admitting: Obstetrics & Gynecology

## 2020-01-14 ENCOUNTER — Telehealth (INDEPENDENT_AMBULATORY_CARE_PROVIDER_SITE_OTHER): Payer: Medicaid Other | Admitting: Obstetrics

## 2020-01-14 ENCOUNTER — Encounter: Payer: Self-pay | Admitting: Obstetrics

## 2020-01-14 VITALS — BP 124/71 | HR 109

## 2020-01-14 DIAGNOSIS — R030 Elevated blood-pressure reading, without diagnosis of hypertension: Secondary | ICD-10-CM

## 2020-01-14 DIAGNOSIS — Z3A30 30 weeks gestation of pregnancy: Secondary | ICD-10-CM

## 2020-01-14 DIAGNOSIS — Z3490 Encounter for supervision of normal pregnancy, unspecified, unspecified trimester: Secondary | ICD-10-CM

## 2020-01-14 DIAGNOSIS — Z3483 Encounter for supervision of other normal pregnancy, third trimester: Secondary | ICD-10-CM

## 2020-01-14 NOTE — Progress Notes (Signed)
Pt is doing well, has no concerns today.

## 2020-01-14 NOTE — Progress Notes (Signed)
OBSTETRICS PRENATAL VIRTUAL VISIT ENCOUNTER NOTE  Provider location: [facility]   I connected with Hannah Bradford on 01/14/20 at  2:00 PM EDT by MyChart Video Encounter at home and verified that I am speaking with the correct person using two identifiers.   I discussed the limitations, risks, security and privacy concerns of performing an evaluation and management service virtually and the availability of in person appointments. I also discussed with the patient that there may be a patient responsible charge related to this service. The patient expressed understanding and agreed to proceed. Subjective:  Jenavive Lamboy is a 24 y.o. G2P1001 at [redacted]w[redacted]d being seen today for ongoing prenatal care.  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  Patient reports heartburn.  Contractions: Not present. Vag. Bleeding: None.  Movement: Present. Denies any leaking of fluid.   The following portions of the patient's history were reviewed and updated as appropriate: allergies, current medications, past family history, past medical history, past social history, past surgical history and problem list.   Objective:   Vitals:   01/14/20 1349  BP: 124/71  Pulse: (!) 109    Fetal Status:     Movement: Present     General:  Alert, oriented and cooperative. Patient is in no acute distress.  Respiratory: Normal respiratory effort, no problems with respiration noted  Mental Status: Normal mood and affect. Normal behavior. Normal judgment and thought content.  Rest of physical exam deferred due to type of encounter  Imaging: [place] MFM OB FOLLOW UP  Result  Date: 12/26/2019 ----------------------------------------------------------------------  OBSTETRICS REPORT                       (Signed Final 12/26/2019 02:32 pm) ---------------------------------------------------------------------- Patient Info  ID #:       [id number]                          D.O.B.:  Aug 11, 1996 (24 yrs)  Name:       Hannah Bradford                Visit Date: 12/26/2019 01:00 pm ---------------------------------------------------------------------- Performed By  Performed By:     Tommie Raymond BS,       Ref. Address:     [address]                                                             [address]                                                             [phone]  [place]                                                             [address]  Attending:        Noralee Space MD        [facility]  Referred By:      [facility] ---------------------------------------------------------------------- Orders   #  Description                          Code         Ordered By   1  [place] MFM OB FOLLOW UP                  [phone]     YU FANG  ----------------------------------------------------------------------   #  Order #                    Accession #                 Episode #   1  [id number]                  [id number]                  [id number]  ---------------------------------------------------------------------- Indications   [redacted] weeks gestation of pregnancy                Z3A.27   Hypertension (Transient)   Anemia during pregnancy in second trimester    O99.012   Other mental disorder complicating             O99.340   pregnancy, second trimester (anxiety)   Antenatal follow-up for nonvisualized fetal    Z36.2   anatomy  ---------------------------------------------------------------------- Fetal Evaluation  Num Of  Fetuses:         1  Fetal Heart Rate(bpm):  137  Cardiac Activity:       Observed  Presentation:           Breech  Placenta:               Posterior  P. Cord Insertion:      Previously Visualized  Amniotic Fluid  AFI FV:      Within normal limits                              Largest Pocket(cm)                              7.3 ---------------------------------------------------------------------- Biometry  BPD:      69.2  mm     G. Age:  27w 6d         52  %    CI:  74.33   %    70 - 86                                                          FL/HC:      19.9   %    18.6 - 20.4  HC:      254.8  mm     G. Age:  27w 5d         28  %    HC/AC:      1.00        1.05 - 1.21  AC:      255.9  mm     G. Age:  29w 5d         95  %    FL/BPD:     73.1   %    71 - 87  FL:       50.6  mm     G. Age:  27w 1d         27  %    FL/AC:      19.8   %    20 - 24  CER:      31.9  mm     G. Age:  27w 6d         56  %  LV:        4.2  mm  Est. FW:    1248  gm    2 lb 12 oz      82  % ---------------------------------------------------------------------- OB History  Gravidity:    2         Term:   1        Prem:   0        SAB:   0  TOP:          0       Ectopic:  0        Living: 1 ---------------------------------------------------------------------- Gestational Age  LMP:           27w 3d        Date:  06/17/19                 EDD:   03/23/20  U/S Today:     28w 1d                                        EDD:   03/18/20  Best:          27w 3d     Det. By:  LMP  (06/17/19)          EDD:   03/23/20 ---------------------------------------------------------------------- Anatomy  Cranium:               Previously seen        LVOT:                   Previously seen  Cavum:                 Previously seen        Aortic Arch:            Previously seen  Ventricles:  Appears normal         Ductal Arch:            Not well visualized  Choroid Plexus:        Previously seen        Diaphragm:              Appears normal  Cerebellum:             Previously seen        Stomach:                Appears normal, left                                                                        sided  Posterior Fossa:       Previously seen        Abdomen:                Previously seen  Nuchal Fold:           Not applicable (>54    Abdominal Wall:         Appears nml (cord                         wks GA)                                        insert, abd wall)  Face:                  Orbits and profile     Cord Vessels:           Previously seen                         previously seen  Lips:                  Previously seen        Kidneys:                Appear normal  Palate:                Not well visualized    Bladder:                Appears normal  Thoracic:              Previously seen        Spine:                  Previously seen  Heart:                 Appears normal         Upper Extremities:      Previously seen                         (4CH, axis, and                         situs)  RVOT:  Previously seen        Lower Extremities:      Previously seen  Other:  Heels and 5th digit previously seen. . Nasal bone previously seen. .          SVC/IVC visualized prev. ---------------------------------------------------------------------- Cervix Uterus Adnexa  Cervix  Normal appearance by transabdominal scan.  Uterus  No abnormality visualized.  Left Ovary  Within normal limits. No adnexal mass visualized.  Right Ovary  Within normal limits. No adnexal mass visualized.  Cul De Sac  No free fluid seen.  Adnexa  No abnormality visualized. ---------------------------------------------------------------------- Impression  Patient returns for fetal growth assessment and completion of  fetal anatomy.  She reports transient hypertension takes  blood pressure measurements at home.  Blood pressure  today at our office is 113/69 mmHg.  Fetal growth is appropriate for gestational age. Amniotic fluid  is normal and good fetal activity is seen. Fetal  anatomical  survey was completed and appears normal.  She will be undergoing screening for gestational diabetes. ---------------------------------------------------------------------- Recommendations  -An appointment was made for her to return in 4 weeks for  fetal growth assessment. ----------------------------------------------------------------------                  Noralee Space, MD Electronically Signed Final Report   12/26/2019 02:32 pm ----------------------------------------------------------------------   Assessment and Plan:  Pregnancy: G2P1001 at [redacted]w[redacted]d 1. Encounter for supervision of normal pregnancy, antepartum, unspecified gravidity  2. Transient elevated blood pressure - BP's are stable   Preterm labor symptoms and general obstetric precautions including but not limited to vaginal bleeding, contractions, leaking of fluid and fetal movement were reviewed in detail with the patient. I discussed the assessment and treatment plan with the patient. The patient was provided an opportunity to ask questions and all were answered. The patient agreed with the plan and demonstrated an understanding of the instructions. The patient was advised to call back or seek an in-person office evaluation/go to MAU at [facility] for any urgent or concerning symptoms. Please refer to After Visit Summary for other counseling recommendations.   I provided 10 minutes of face-to-face time during this encounter.  Return in about 2 weeks (around 01/28/2020) for MyChart.  Future Appointments  Date Time Provider Department Center  01/24/2020 12:45 PM [facility] NURSE [facility]  01/24/2020 12:45 PM [facility] US5 [facility]    Coral Ceo, MD Center for [facility] 01/14/2020

## 2020-01-18 ENCOUNTER — Inpatient Hospital Stay (HOSPITAL_COMMUNITY)
Admission: AD | Admit: 2020-01-18 | Discharge: 2020-01-18 | Disposition: A | Discharge status: Home / Self Care | Payer: Medicaid Other | Attending: Obstetrics & Gynecology | Admitting: Obstetrics & Gynecology

## 2020-01-18 ENCOUNTER — Encounter (HOSPITAL_COMMUNITY): Payer: Self-pay | Admitting: Obstetrics & Gynecology

## 2020-01-18 ENCOUNTER — Other Ambulatory Visit: Payer: Self-pay

## 2020-01-18 DIAGNOSIS — M7918 Myalgia, other site: Secondary | ICD-10-CM | POA: Diagnosis not present

## 2020-01-18 DIAGNOSIS — R109 Unspecified abdominal pain: Secondary | ICD-10-CM

## 2020-01-18 DIAGNOSIS — Z87891 Personal history of nicotine dependence: Secondary | ICD-10-CM | POA: Insufficient documentation

## 2020-01-18 DIAGNOSIS — O26893 Other specified pregnancy related conditions, third trimester: Secondary | ICD-10-CM | POA: Insufficient documentation

## 2020-01-18 DIAGNOSIS — Z3689 Encounter for other specified antenatal screening: Secondary | ICD-10-CM

## 2020-01-18 DIAGNOSIS — Z3A30 30 weeks gestation of pregnancy: Secondary | ICD-10-CM | POA: Diagnosis not present

## 2020-01-18 HISTORY — DX: Anemia, unspecified: D64.9

## 2020-01-18 LAB — URINALYSIS, ROUTINE W REFLEX MICROSCOPIC
Bilirubin Urine: NEGATIVE
Glucose, UA: NEGATIVE mg/dL
Hgb urine dipstick: NEGATIVE
Ketones, ur: NEGATIVE mg/dL
Leukocytes,Ua: NEGATIVE
Nitrite: NEGATIVE
Protein, ur: NEGATIVE mg/dL
Specific Gravity, Urine: 1.019 (ref 1.005–1.030)
pH: 7 (ref 5.0–8.0)

## 2020-01-18 MED ORDER — LACTATED RINGERS IV BOLUS
1000.0000 mL | Freq: Once | INTRAVENOUS | Status: AC
  Administered 2020-01-18: 1000 mL via INTRAVENOUS

## 2020-01-18 MED ORDER — CYCLOBENZAPRINE HCL 5 MG PO TABS
10.0000 mg | ORAL_TABLET | Freq: Once | ORAL | Status: AC
  Administered 2020-01-18: 10 mg via ORAL
  Filled 2020-01-18: qty 2

## 2020-01-18 MED ORDER — ACETAMINOPHEN 500 MG PO TABS
1000.0000 mg | ORAL_TABLET | Freq: Once | ORAL | Status: AC
  Administered 2020-01-18: 1000 mg via ORAL
  Filled 2020-01-18: qty 2

## 2020-01-18 NOTE — MAU Note (Signed)
Pt reports to MAU c/o lower left sided abdominal pain that started about 2 hours ago. Pt rates the pain is a 8/10 and it is constant pt reports moving and touching it increases the pain. No vaginal bleeding or discharge. Pt unsure of FM because of the pain.

## 2020-01-19 ENCOUNTER — Other Ambulatory Visit: Payer: Self-pay | Admitting: Advanced Practice Midwife

## 2020-01-19 MED ORDER — CYCLOBENZAPRINE HCL 10 MG PO TABS: 10.0000 mg | ORAL_TABLET | Freq: Three times a day (TID) | ORAL | 2 refills | Status: AC | PRN

## 2020-01-19 NOTE — Discharge Instructions (Signed)

## 2020-01-19 NOTE — MAU Provider Note (Signed)
History     CSN: [id number]  Arrival date and time: 01/18/20 2214   First Provider Initiated Contact with Patient 01/18/20 2245      Chief Complaint  Patient presents with  . Abdominal Pain   HPI Hannah Bradford is a 24 y.o. G2P1001 at [redacted]w[redacted]d who presents to MAU with chief complaint of left mid-abdominal pain. This is a new problem, onset about two hours prior to her arrival to MAU. She rates her pain as 5/10, constant and denies aggravating or alleviating factors. She has not taken medication or tried other treatments for this complaint. She denies vaginal bleeding, leaking of fluid, decreased fetal movement, fever, falls, or recent illness.   She receives care with [facility] and her next appointment is 01/28/2020  OB History    Gravida  2   Para  1   Term  1   Preterm      AB      Living  1     SAB      TAB      Ectopic      Multiple  0   Live Births  1           Past Medical History:  Diagnosis Date  . Anemia   . Anxiety   . Medical history non-contributory     Past Surgical History:  Procedure Laterality Date  . NO PAST SURGERIES      Family History  Problem Relation Age of Onset  . Hypertension Maternal Grandmother   . Hypertension Paternal Grandmother   . Diabetes Paternal Grandmother     Social History   Tobacco Use  . Smoking status: Former Smoker    Types: Cigarettes    Quit date: 01/11/2018    Years since quitting: 2.0  . Smokeless tobacco: Never Used  Substance Use Topics  . Alcohol use: Never  . Drug use: Not Currently    Types: Marijuana    Comment: last use Oct 2019    Allergies: No Known Allergies  No medications prior to admission.    Review of Systems  Constitutional: Negative for fever.  Gastrointestinal: Positive for abdominal pain.  Genitourinary: Negative for dysuria and vaginal bleeding.  All other systems reviewed and are negative.  Physical Exam   Blood pressure 110/66, pulse 99, temperature 97.7 F  (36.5 C), temperature source Oral, resp. rate 18, weight 72.2 kg, last menstrual period 06/17/2019, unknown if currently breastfeeding.  Physical Exam  Nursing note and vitals reviewed. Constitutional: She is oriented to person, place, and time. She appears well-developed and well-nourished.  Cardiovascular: Normal rate and normal heart sounds.  Respiratory: Effort normal and breath sounds normal.  GI: Soft. She exhibits no distension. There is no abdominal tenderness. There is no rebound and no guarding.  Gravid  Genitourinary:    No vaginal discharge.   Neurological: She is alert and oriented to person, place, and time.  Skin: Skin is warm and dry.  Psychiatric: She has a normal mood and affect. Her behavior is normal. Judgment and thought content normal.    MAU Course/MDM  Procedures  --Reactive tracing: baseline 125, mod variability, positive 10- x 10 accels, no decels --Toco: UI --Cervix closed/thick/posterior --OB history Term SVD x 1, same partner  Patient Vitals for the past 24 hrs:  BP Temp Temp src Pulse Resp Weight  01/19/20 0007 110/66 -- -- 99 18 --  01/18/20 2223 125/65 97.7 F (36.5 C) Oral (!) 102 16 72.2 kg  Results for orders placed or performed during the hospital encounter of 01/18/20 (from the past 24 hour(s))  Urinalysis, Routine w reflex microscopic     Status: Abnormal   Collection Time: 01/18/20 10:45 PM  Result Value Ref Range   Color, Urine YELLOW YELLOW   APPearance HAZY (A) CLEAR   Specific Gravity, Urine 1.019 1.005 - 1.030   pH 7.0 5.0 - 8.0   Glucose, UA NEGATIVE NEGATIVE mg/dL   Hgb urine dipstick NEGATIVE NEGATIVE   Bilirubin Urine NEGATIVE NEGATIVE   Ketones, ur NEGATIVE NEGATIVE mg/dL   Protein, ur NEGATIVE NEGATIVE mg/dL   Nitrite NEGATIVE NEGATIVE   Leukocytes,Ua NEGATIVE NEGATIVE   Meds ordered this encounter  Medications  . lactated ringers bolus 1,000 mL  . acetaminophen (TYLENOL) tablet 1,000 mg  . cyclobenzaprine  (FLEXERIL) tablet 10 mg   Assessment and Plan  --24 y.o. G2P1001 at [redacted]w[redacted]d  --Reactive tracing, closed cervix --Patient denies pain, requests discharge one hour after medications --Outpatient rx Flexeril placed under separate encounter --Discharge home in stable condition  F/U: --[facility] 01/28/2020  Calvert Cantor, CNM 01/19/2020, 12:49 AM

## 2020-01-19 NOTE — Progress Notes (Signed)
S/p MAU visit, requests new rx Flexeril  Clayton Bibles, MSN, CNM Certified Nurse Midwife, [facility] for [employer], [facility] 01/19/20 12:45 AM

## 2020-01-24 ENCOUNTER — Ambulatory Visit: Payer: Medicaid Other

## 2020-01-24 ENCOUNTER — Ambulatory Visit (HOSPITAL_COMMUNITY): Payer: Medicaid Other | Attending: Obstetrics and Gynecology

## 2020-01-28 ENCOUNTER — Encounter: Payer: Medicaid Other | Admitting: Obstetrics & Gynecology

## 2020-01-28 DIAGNOSIS — Z3490 Encounter for supervision of normal pregnancy, unspecified, unspecified trimester: Secondary | ICD-10-CM

## 2020-01-28 NOTE — Progress Notes (Signed)
Virtual ROB   CC: None  

## 2020-01-29 ENCOUNTER — Telehealth: Payer: Self-pay | Admitting: Obstetrics & Gynecology

## 2020-02-13 ENCOUNTER — Telehealth: Payer: Self-pay

## 2020-02-13 ENCOUNTER — Telehealth: Payer: Medicaid Other | Admitting: Obstetrics and Gynecology

## 2020-02-13 NOTE — Telephone Encounter (Signed)
Pt refused virtual video today and requested to r/s. Message sent to appt desk to r/s virtual visit.

## 2020-02-20 ENCOUNTER — Encounter: Payer: Medicaid Other | Admitting: Obstetrics

## 2020-02-27 ENCOUNTER — Encounter: Payer: Medicaid Other | Admitting: Obstetrics

## 2021-01-22 IMAGING — US US MFM OB COMP +14 WKS
1 series · 13 of 28 positions shown · non-contrast
Comparison: none

[Series 1: us mfm ob comp +14 wks · 13 of 82 slices shown]
[im 4/82]
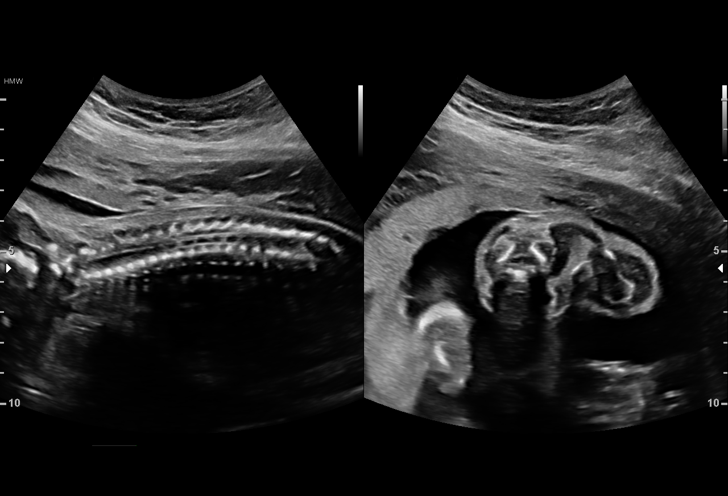
[im 10/82]
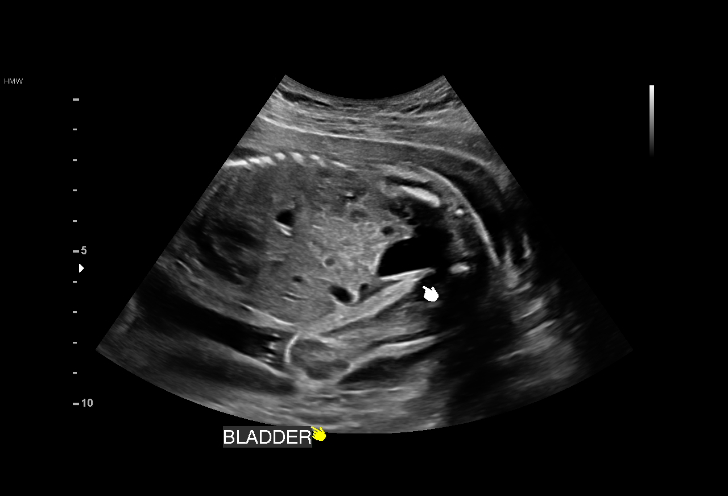
[im 16/82]
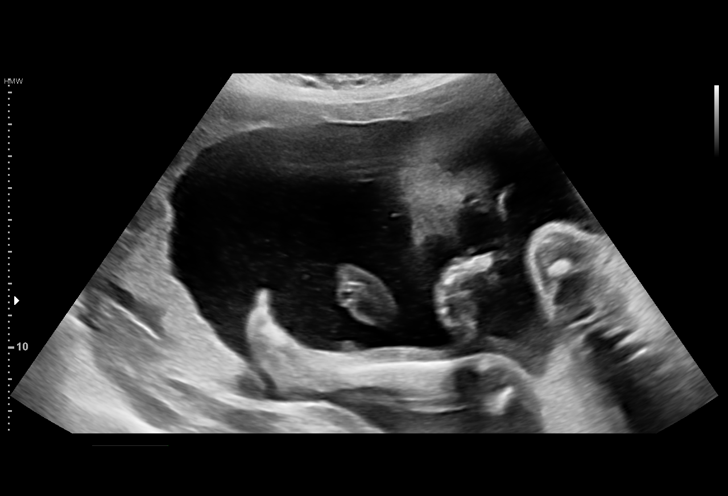
[im 22/82]
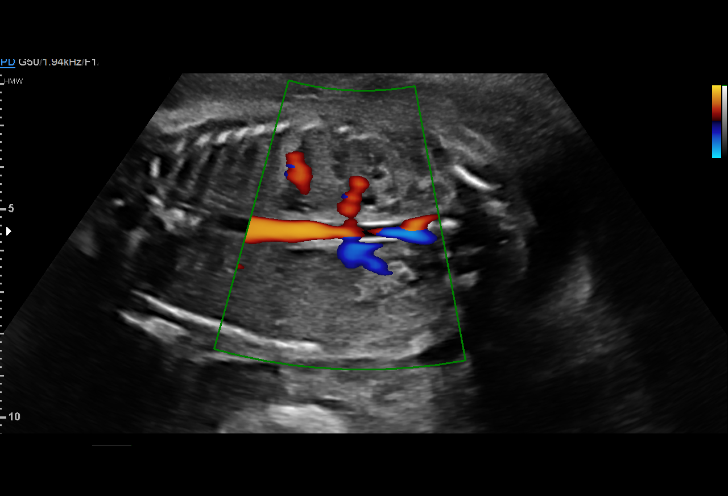
[im 28/82]
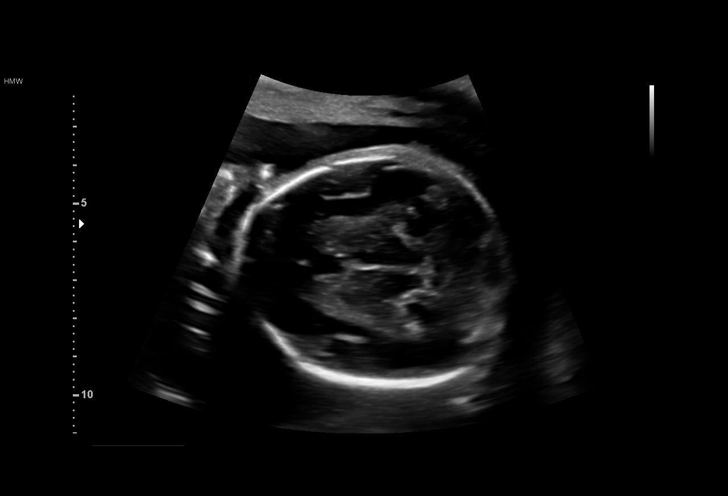
[im 34/82]
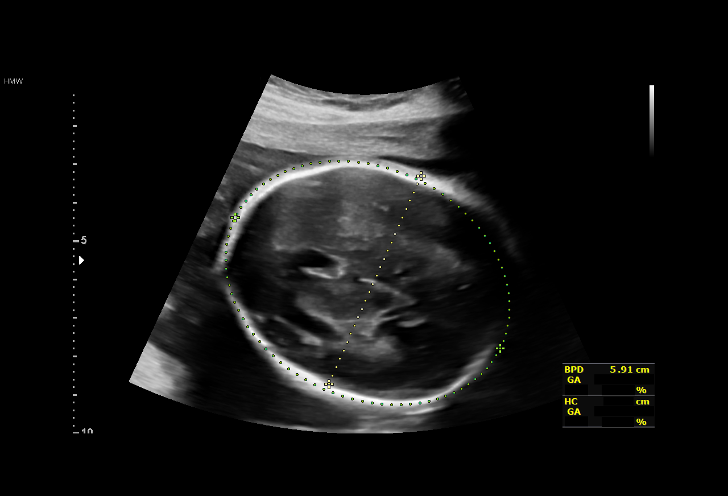
[im 43/82]
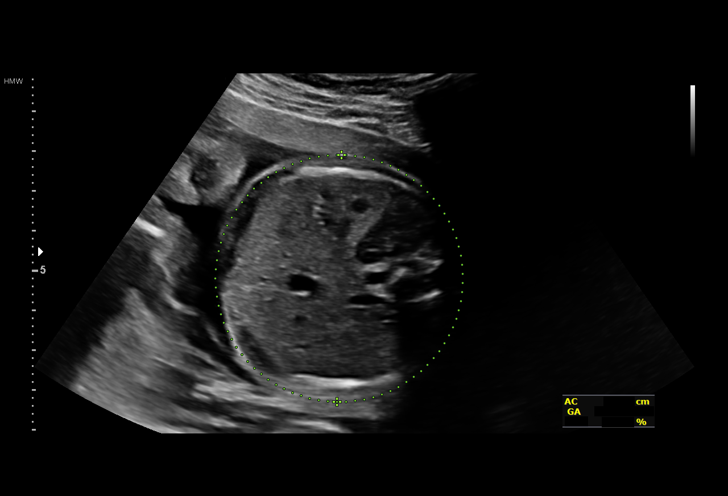
[im 49/82]
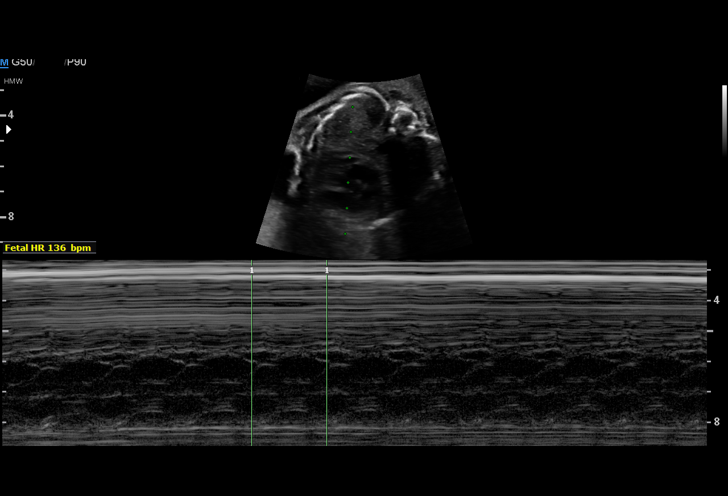
[im 55/82]
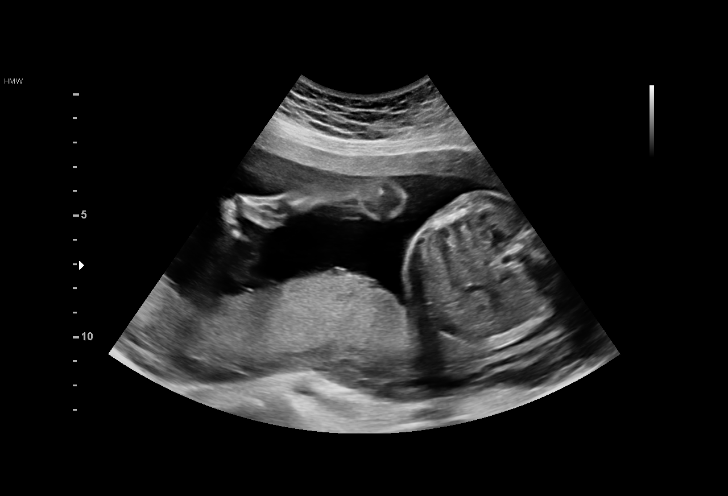
[im 61/82]
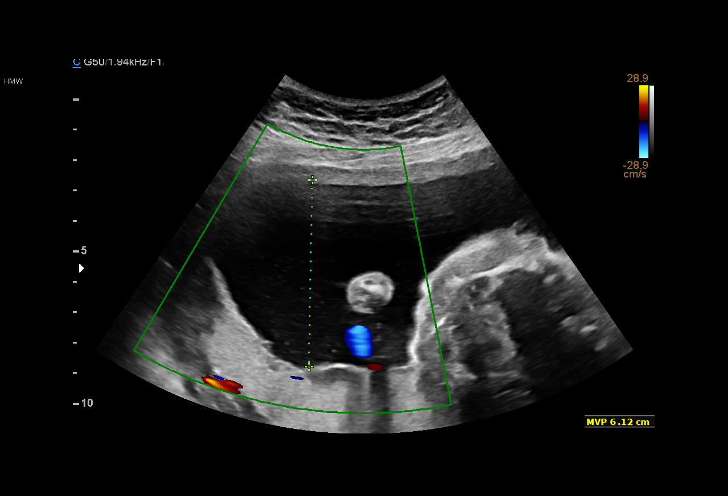
[im 67/82]
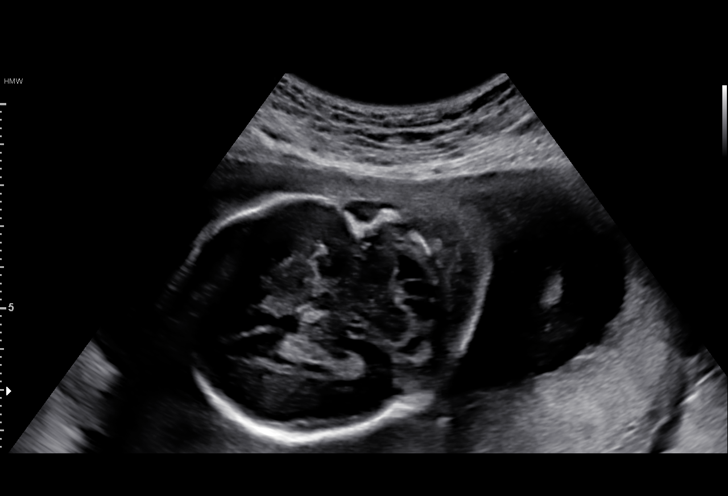
[im 73/82]
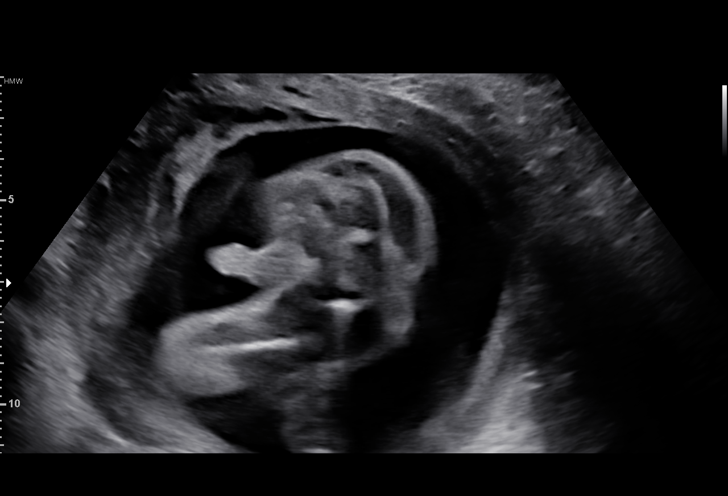
[im 79/82]
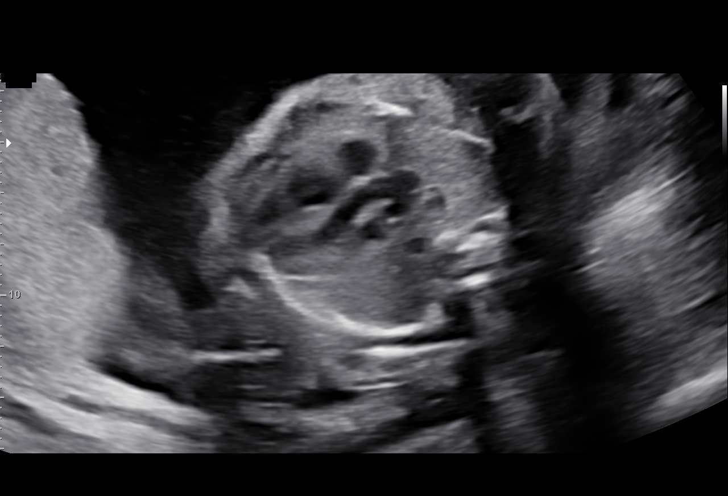

[13 of 28 positions shown; findings below may reference images not displayed]

----------------------------------------------------------------------

 ----------------------------------------------------------------------
Indications

  Encounter for antenatal screening for
  malformations
  23 weeks gestation of pregnancy
  Low risk NIPS, U.UQQ, negative HORIZON
 ----------------------------------------------------------------------
Fetal Evaluation

 Num Of Fetuses:         1
 Fetal Heart Rate(bpm):  136
 Cardiac Activity:       Observed
 Presentation:           Breech
 Placenta:               Posterior
 P. Cord Insertion:      Visualized, central

 Amniotic Fluid
 AFI FV:      Within normal limits

                             Largest Pocket(cm)

Biometry

 BPD:      58.7  mm     G. Age:  24w 0d         61  %    CI:        74.18   %    70 - 86
                                                         FL/HC:      19.3   %    18.7 -
 HC:      216.4  mm     G. Age:  23w 5d         38  %    HC/AC:      1.11        1.05 -
 AC:      195.2  mm     G. Age:  24w 1d         62  %    FL/BPD:     71.2   %    71 - 87
 FL:       41.8  mm     G. Age:  23w 4d         39  %    FL/AC:      21.4   %    20 - 24
 HUM:      39.2  mm     G. Age:  24w 0d         51  %
 CER:      26.1  mm     G. Age:  24w 0d         57  %
 CM:        8.6  mm
 Est. FW:     643  gm      1 lb 7 oz     59  %
OB History

 Gravidity:    2         Term:   1        Prem:   0        SAB:   0
 TOP:          0       Ectopic:  0        Living: 1
Gestational Age

 LMP:           23w 4d        Date:  06/17/19                 EDD:   03/23/20
 U/S Today:     23w 6d                                        EDD:   03/21/20
 Best:          23w 4d     Det. By:  LMP  (06/17/19)          EDD:   03/23/20
Anatomy

 Cranium:               Appears normal         LVOT:                   Not well visualized
 Cavum:                 Appears normal         Aortic Arch:            Appears normal
 Ventricles:            Appears normal         Ductal Arch:            Not well visualized
 Choroid Plexus:        Appears normal         Diaphragm:              Appears normal
 Cerebellum:            Appears normal         Stomach:                Appears normal, left
                                                                       sided
 Posterior Fossa:       Appears normal         Abdomen:                Appears normal
 Nuchal Fold:           Not applicable (>20    Abdominal Wall:         Appears nml (cord
                        wks GA)                                        insert, abd wall)
 Face:                  Appears normal         Cord Vessels:           Appears normal (3
                        (orbits and profile)                           vessel cord)
 Lips:                  Appears normal         Kidneys:                Appear normal
 Palate:                Not well visualized    Bladder:                Appears normal
 Thoracic:              Appears normal         Spine:                  Appears normal
 Heart:                 Appears normal         Upper Extremities:      Appears normal
                        (4CH, axis, and
                        situs)
 RVOT:                  Not well visualized    Lower Extremities:      Appears normal

 Other:  Heels and 5th digit visualized. Nasal bone visualized. SVC/IVC
         visualized. Technically difficult due to fetal position.
Cervix Uterus Adnexa

 Cervix
 Length:            4.8  cm.
 Normal appearance by transabdominal scan.

 Uterus
 No abnormality visualized.

 Left Ovary
 No adnexal mass visualized.

 Right Ovary
 No adnexal mass visualized.

 Cul De Sac
 No free fluid seen.

 Adnexa
 No abnormality visualized.
Comments

 This patient was seen for a detailed fetal anatomy scan.  The
 patient reports that she possibly may have elevated blood
 pressures.  She notes that her blood pressures at home were
 in the 140s over 80s to 90s range this morning. She denies
 any other significant past medical history and denies any
 problems in her current pregnancy.
 She had a cell free DNA test earlier in her pregnancy which
 indicated a low risk for trisomy 21, 18, and 13. A male fetus is
 predicted.
 She was informed that the fetal growth and amniotic fluid
 level were appropriate for her gestational age.
 There were no obvious fetal anomalies noted on today's
 ultrasound exam.  However, today's exam was limited due to
 the fetal position.
 The patient was informed that anomalies may be missed due
 to technical limitations. If the fetus is in a suboptimal position
 or maternal habitus is increased, visualization of the fetus in
 the maternal uterus may be impaired.
 Should her blood pressures remain elevated, we will continue
 to follow her with serial growth ultrasounds.
 A follow-up exam was scheduled in 4 weeks.
